# Patient Record
Sex: Male | Born: 1960 | Race: Black or African American | Hispanic: No | Marital: Single | Smoking: Current every day smoker
Health system: Southern US, Community
[De-identification: ages and names within clinical notes are randomized; demographics above are authoritative.]

## PROBLEM LIST (undated history)

## (undated) DIAGNOSIS — I2119 ST elevation (STEMI) myocardial infarction involving other coronary artery of inferior wall: Secondary | ICD-10-CM

## (undated) DIAGNOSIS — G4733 Obstructive sleep apnea (adult) (pediatric): Secondary | ICD-10-CM

## (undated) DIAGNOSIS — M4714 Other spondylosis with myelopathy, thoracic region: Secondary | ICD-10-CM

## (undated) DIAGNOSIS — R269 Unspecified abnormalities of gait and mobility: Secondary | ICD-10-CM

## (undated) DIAGNOSIS — E7800 Pure hypercholesterolemia, unspecified: Secondary | ICD-10-CM

## (undated) DIAGNOSIS — K219 Gastro-esophageal reflux disease without esophagitis: Secondary | ICD-10-CM

## (undated) DIAGNOSIS — Z720 Tobacco use: Secondary | ICD-10-CM

## (undated) HISTORY — DX: Other spondylosis with myelopathy, thoracic region: M47.14

## (undated) HISTORY — DX: Unspecified abnormalities of gait and mobility: R26.9

## (undated) HISTORY — PX: SPINE SURGERY: SHX786

## (undated) HISTORY — DX: Obstructive sleep apnea (adult) (pediatric): G47.33

---

## 2016-11-30 ENCOUNTER — Encounter (HOSPITAL_COMMUNITY)
Admission: EM | Disposition: A | Discharge status: Home / Self Care | Payer: Self-pay | Source: Home / Self Care | Attending: Interventional Cardiology

## 2016-11-30 ENCOUNTER — Encounter (HOSPITAL_COMMUNITY): Payer: Self-pay

## 2016-11-30 ENCOUNTER — Inpatient Hospital Stay (HOSPITAL_COMMUNITY)
Admission: EM | Admit: 2016-11-30 | Discharge: 2016-12-02 | DRG: 282 | Disposition: A | Discharge status: Home / Self Care | Payer: Medicaid Other | Attending: Interventional Cardiology | Admitting: Interventional Cardiology

## 2016-11-30 DIAGNOSIS — I2582 Chronic total occlusion of coronary artery: Secondary | ICD-10-CM | POA: Diagnosis present

## 2016-11-30 DIAGNOSIS — G4733 Obstructive sleep apnea (adult) (pediatric): Secondary | ICD-10-CM | POA: Diagnosis present

## 2016-11-30 DIAGNOSIS — K219 Gastro-esophageal reflux disease without esophagitis: Secondary | ICD-10-CM | POA: Diagnosis present

## 2016-11-30 DIAGNOSIS — E669 Obesity, unspecified: Secondary | ICD-10-CM | POA: Diagnosis present

## 2016-11-30 DIAGNOSIS — Z23 Encounter for immunization: Secondary | ICD-10-CM | POA: Diagnosis not present

## 2016-11-30 DIAGNOSIS — E7800 Pure hypercholesterolemia, unspecified: Secondary | ICD-10-CM

## 2016-11-30 DIAGNOSIS — Z8249 Family history of ischemic heart disease and other diseases of the circulatory system: Secondary | ICD-10-CM | POA: Diagnosis not present

## 2016-11-30 DIAGNOSIS — Z9861 Coronary angioplasty status: Secondary | ICD-10-CM

## 2016-11-30 DIAGNOSIS — I2121 ST elevation (STEMI) myocardial infarction involving left circumflex coronary artery: Secondary | ICD-10-CM | POA: Diagnosis not present

## 2016-11-30 DIAGNOSIS — G8929 Other chronic pain: Secondary | ICD-10-CM | POA: Diagnosis not present

## 2016-11-30 DIAGNOSIS — I213 ST elevation (STEMI) myocardial infarction of unspecified site: Secondary | ICD-10-CM | POA: Diagnosis present

## 2016-11-30 DIAGNOSIS — Z6837 Body mass index (BMI) 37.0-37.9, adult: Secondary | ICD-10-CM | POA: Diagnosis not present

## 2016-11-30 DIAGNOSIS — T448X5A Adverse effect of centrally-acting and adrenergic-neuron-blocking agents, initial encounter: Secondary | ICD-10-CM | POA: Diagnosis not present

## 2016-11-30 DIAGNOSIS — E785 Hyperlipidemia, unspecified: Secondary | ICD-10-CM | POA: Diagnosis present

## 2016-11-30 DIAGNOSIS — Z720 Tobacco use: Secondary | ICD-10-CM | POA: Diagnosis not present

## 2016-11-30 DIAGNOSIS — I2119 ST elevation (STEMI) myocardial infarction involving other coronary artery of inferior wall: Secondary | ICD-10-CM | POA: Diagnosis present

## 2016-11-30 DIAGNOSIS — I2510 Atherosclerotic heart disease of native coronary artery without angina pectoris: Secondary | ICD-10-CM | POA: Diagnosis present

## 2016-11-30 DIAGNOSIS — G894 Chronic pain syndrome: Secondary | ICD-10-CM | POA: Diagnosis present

## 2016-11-30 DIAGNOSIS — F17290 Nicotine dependence, other tobacco product, uncomplicated: Secondary | ICD-10-CM | POA: Diagnosis present

## 2016-11-30 DIAGNOSIS — I952 Hypotension due to drugs: Secondary | ICD-10-CM | POA: Diagnosis not present

## 2016-11-30 DIAGNOSIS — I252 Old myocardial infarction: Secondary | ICD-10-CM | POA: Diagnosis present

## 2016-11-30 DIAGNOSIS — R079 Chest pain, unspecified: Secondary | ICD-10-CM | POA: Diagnosis present

## 2016-11-30 HISTORY — DX: ST elevation (STEMI) myocardial infarction involving other coronary artery of inferior wall: I21.19

## 2016-11-30 HISTORY — DX: Pure hypercholesterolemia, unspecified: E78.00

## 2016-11-30 HISTORY — DX: Tobacco use: Z72.0

## 2016-11-30 HISTORY — DX: Gastro-esophageal reflux disease without esophagitis: K21.9

## 2016-11-30 HISTORY — PX: CARDIAC CATHETERIZATION: SHX172

## 2016-11-30 LAB — TROPONIN I
TROPONIN I: 0.5 ng/mL — AB (ref <0.03)
TROPONIN I: 8.56 ng/mL — AB (ref <0.03)
Troponin I: 0.05 ng/mL (ref <0.03)

## 2016-11-30 LAB — POCT ACTIVATED CLOTTING TIME
Activated Clotting Time: 268 seconds
Activated Clotting Time: 324 seconds

## 2016-11-30 LAB — COMPREHENSIVE METABOLIC PANEL
ALBUMIN: 3.7 g/dL (ref 3.5–5.0)
ALK PHOS: 32 U/L — AB (ref 38–126)
ALT: 25 U/L (ref 17–63)
AST: 29 U/L (ref 15–41)
Anion gap: 11 (ref 5–15)
BUN: 12 mg/dL (ref 6–20)
CHLORIDE: 101 mmol/L (ref 101–111)
CO2: 24 mmol/L (ref 22–32)
Calcium: 9.4 mg/dL (ref 8.9–10.3)
Creatinine, Ser: 1.3 mg/dL — ABNORMAL HIGH (ref 0.61–1.24)
GFR calc Af Amer: >60 mL/min (ref >60)
GFR calc non Af Amer: >60 mL/min (ref >60)
GLUCOSE: 108 mg/dL — AB (ref 65–99)
Potassium: 4.1 mmol/L (ref 3.5–5.1)
SODIUM: 136 mmol/L (ref 135–145)
Total Bilirubin: 1 mg/dL (ref 0.3–1.2)
Total Protein: 7.4 g/dL (ref 6.5–8.1)

## 2016-11-30 LAB — DIFFERENTIAL
Basophils Absolute: 0 10*3/uL (ref 0.0–0.1)
Basophils Relative: 0 %
Eosinophils Absolute: 0.9 10*3/uL — ABNORMAL HIGH (ref 0.0–0.7)
Eosinophils Relative: 10 %
LYMPHS ABS: 3.4 10*3/uL (ref 0.7–4.0)
LYMPHS PCT: 36 %
MONO ABS: 0.9 10*3/uL (ref 0.1–1.0)
MONOS PCT: 9 %
NEUTROS ABS: 4.2 10*3/uL (ref 1.7–7.7)
Neutrophils Relative %: 45 %

## 2016-11-30 LAB — LIPID PANEL
CHOL/HDL RATIO: 5.1 ratio
Cholesterol: 218 mg/dL — ABNORMAL HIGH (ref 0–200)
HDL: 43 mg/dL (ref >40)
LDL CALC: 129 mg/dL — AB (ref 0–99)
Triglycerides: 228 mg/dL — ABNORMAL HIGH (ref <150)
VLDL: 46 mg/dL — ABNORMAL HIGH (ref 0–40)

## 2016-11-30 LAB — CBC
HEMATOCRIT: 48.5 % (ref 39.0–52.0)
HEMOGLOBIN: 17 g/dL (ref 13.0–17.0)
MCH: 36.5 pg — ABNORMAL HIGH (ref 26.0–34.0)
MCHC: 35.1 g/dL (ref 30.0–36.0)
MCV: 104.1 fL — AB (ref 78.0–100.0)
Platelets: 278 10*3/uL (ref 150–400)
RBC: 4.66 MIL/uL (ref 4.22–5.81)
RDW: 13.4 % (ref 11.5–15.5)
WBC: 9.4 10*3/uL (ref 4.0–10.5)

## 2016-11-30 LAB — HEPARIN LEVEL (UNFRACTIONATED): Heparin Unfractionated: 0.3 IU/mL (ref 0.30–0.70)

## 2016-11-30 LAB — PROTIME-INR
INR: 0.94
Prothrombin Time: 12.5 seconds (ref 11.4–15.2)

## 2016-11-30 LAB — APTT: APTT: 31 s (ref 24–36)

## 2016-11-30 LAB — TSH: TSH: 2.547 u[IU]/mL (ref 0.350–4.500)

## 2016-11-30 SURGERY — LEFT HEART CATH AND CORONARY ANGIOGRAPHY

## 2016-11-30 MED ORDER — VERAPAMIL HCL 2.5 MG/ML IV SOLN
INTRAVENOUS | Status: DC | PRN
  Administered 2016-11-30: 10 mL via INTRA_ARTERIAL

## 2016-11-30 MED ORDER — ALPRAZOLAM 0.25 MG PO TABS
0.2500 mg | ORAL_TABLET | Freq: Two times a day (BID) | ORAL | Status: DC | PRN
  Administered 2016-11-30: 0.25 mg via ORAL
  Filled 2016-11-30: qty 1

## 2016-11-30 MED ORDER — SODIUM CHLORIDE 0.9 % IV SOLN: INTRAVENOUS | Status: AC

## 2016-11-30 MED ORDER — HEPARIN SODIUM (PORCINE) 5000 UNIT/ML IJ SOLN
4000.0000 [IU] | Freq: Once | INTRAMUSCULAR | Status: AC
  Administered 2016-11-30: 4000 [IU] via INTRAVENOUS

## 2016-11-30 MED ORDER — MORPHINE SULFATE (PF) 2 MG/ML IV SOLN: 2.0000 mg | INTRAVENOUS | Status: DC | PRN

## 2016-11-30 MED ORDER — IOPAMIDOL (ISOVUE-370) INJECTION 76%
INTRAVENOUS | Status: AC
  Filled 2016-11-30: qty 100

## 2016-11-30 MED ORDER — ZOLPIDEM TARTRATE 5 MG PO TABS: 5.0000 mg | ORAL_TABLET | Freq: Every evening | ORAL | Status: DC | PRN

## 2016-11-30 MED ORDER — NICOTINE 21 MG/24HR TD PT24
21.0000 mg | MEDICATED_PATCH | Freq: Every day | TRANSDERMAL | Status: DC
  Administered 2016-11-30 – 2016-12-02 (×3): 21 mg via TRANSDERMAL
  Filled 2016-11-30 (×3): qty 1

## 2016-11-30 MED ORDER — CARVEDILOL 3.125 MG PO TABS
3.1250 mg | ORAL_TABLET | Freq: Two times a day (BID) | ORAL | Status: DC
  Administered 2016-11-30 – 2016-12-02 (×4): 3.125 mg via ORAL
  Filled 2016-11-30 (×4): qty 1

## 2016-11-30 MED ORDER — BACLOFEN 20 MG PO TABS
20.0000 mg | ORAL_TABLET | Freq: Two times a day (BID) | ORAL | Status: DC
  Administered 2016-11-30 – 2016-12-02 (×5): 20 mg via ORAL
  Filled 2016-11-30 (×6): qty 1

## 2016-11-30 MED ORDER — SODIUM CHLORIDE 0.9% FLUSH: 3.0000 mL | INTRAVENOUS | Status: DC | PRN

## 2016-11-30 MED ORDER — PREGABALIN 50 MG PO CAPS
300.0000 mg | ORAL_CAPSULE | Freq: Three times a day (TID) | ORAL | Status: DC
  Administered 2016-11-30 – 2016-12-02 (×8): 300 mg via ORAL
  Filled 2016-11-30: qty 6
  Filled 2016-11-30: qty 12
  Filled 2016-11-30 (×6): qty 6

## 2016-11-30 MED ORDER — HEPARIN (PORCINE) IN NACL 100-0.45 UNIT/ML-% IJ SOLN: 1300.0000 [IU]/h | INTRAMUSCULAR | Status: DC

## 2016-11-30 MED ORDER — MIDAZOLAM HCL 2 MG/2ML IJ SOLN
INTRAMUSCULAR | Status: AC
  Filled 2016-11-30: qty 2

## 2016-11-30 MED ORDER — PANTOPRAZOLE SODIUM 40 MG PO TBEC
40.0000 mg | DELAYED_RELEASE_TABLET | Freq: Every day | ORAL | Status: DC
  Administered 2016-11-30 – 2016-12-02 (×3): 40 mg via ORAL
  Filled 2016-11-30 (×3): qty 1

## 2016-11-30 MED ORDER — HEPARIN (PORCINE) IN NACL 2-0.9 UNIT/ML-% IJ SOLN
INTRAMUSCULAR | Status: AC
  Filled 2016-11-30: qty 500

## 2016-11-30 MED ORDER — NITROGLYCERIN IN D5W 200-5 MCG/ML-% IV SOLN: 20.0000 ug/min | INTRAVENOUS | Status: DC

## 2016-11-30 MED ORDER — FENTANYL CITRATE (PF) 100 MCG/2ML IJ SOLN
INTRAMUSCULAR | Status: AC
  Filled 2016-11-30: qty 2

## 2016-11-30 MED ORDER — SODIUM CHLORIDE 0.9 % IV SOLN
INTRAVENOUS | Status: DC | PRN
  Administered 2016-11-30: 200 mL/h via INTRAVENOUS

## 2016-11-30 MED ORDER — CLOPIDOGREL BISULFATE 75 MG PO TABS
75.0000 mg | ORAL_TABLET | Freq: Every day | ORAL | Status: DC
  Administered 2016-12-01 – 2016-12-02 (×2): 75 mg via ORAL
  Filled 2016-11-30 (×2): qty 1

## 2016-11-30 MED ORDER — DOCUSATE SODIUM 100 MG PO CAPS
100.0000 mg | ORAL_CAPSULE | Freq: Two times a day (BID) | ORAL | Status: DC
  Administered 2016-11-30 – 2016-12-02 (×4): 100 mg via ORAL
  Filled 2016-11-30 (×4): qty 1

## 2016-11-30 MED ORDER — HEPARIN SODIUM (PORCINE) 5000 UNIT/ML IJ SOLN
INTRAMUSCULAR | Status: AC
  Filled 2016-11-30: qty 1

## 2016-11-30 MED ORDER — HYDROCODONE-ACETAMINOPHEN 10-325 MG PO TABS
1.0000 | ORAL_TABLET | Freq: Four times a day (QID) | ORAL | Status: DC
  Administered 2016-11-30 – 2016-12-02 (×8): 1 via ORAL
  Filled 2016-11-30 (×10): qty 1

## 2016-11-30 MED ORDER — ONDANSETRON HCL 4 MG/2ML IJ SOLN: 4.0000 mg | Freq: Four times a day (QID) | INTRAMUSCULAR | Status: DC | PRN

## 2016-11-30 MED ORDER — VERAPAMIL HCL 2.5 MG/ML IV SOLN
INTRAVENOUS | Status: AC
  Filled 2016-11-30: qty 2

## 2016-11-30 MED ORDER — MAGNESIUM HYDROXIDE 400 MG/5ML PO SUSP
30.0000 mL | Freq: Every day | ORAL | Status: DC | PRN
  Administered 2016-11-30 – 2016-12-02 (×2): 30 mL via ORAL
  Filled 2016-11-30 (×2): qty 30

## 2016-11-30 MED ORDER — SODIUM CHLORIDE 0.9% FLUSH
3.0000 mL | Freq: Two times a day (BID) | INTRAVENOUS | Status: DC
  Administered 2016-11-30 – 2016-12-02 (×3): 3 mL via INTRAVENOUS

## 2016-11-30 MED ORDER — IOPAMIDOL (ISOVUE-370) INJECTION 76%
INTRAVENOUS | Status: DC | PRN
  Administered 2016-11-30: 210 mL via INTRA_ARTERIAL

## 2016-11-30 MED ORDER — ACETAMINOPHEN 325 MG PO TABS: 650.0000 mg | ORAL_TABLET | ORAL | Status: DC | PRN

## 2016-11-30 MED ORDER — HEPARIN (PORCINE) IN NACL 2-0.9 UNIT/ML-% IJ SOLN
INTRAMUSCULAR | Status: DC | PRN
  Administered 2016-11-30: 1500 mL

## 2016-11-30 MED ORDER — HEPARIN SODIUM (PORCINE) 1000 UNIT/ML IJ SOLN
INTRAMUSCULAR | Status: AC
  Filled 2016-11-30: qty 1

## 2016-11-30 MED ORDER — BISACODYL 10 MG RE SUPP: 10.0000 mg | Freq: Every day | RECTAL | Status: DC | PRN

## 2016-11-30 MED ORDER — LIDOCAINE HCL (PF) 1 % IJ SOLN
INTRAMUSCULAR | Status: DC | PRN
  Administered 2016-11-30: 2 mL via INTRADERMAL

## 2016-11-30 MED ORDER — PNEUMOCOCCAL VAC POLYVALENT 25 MCG/0.5ML IJ INJ: 0.5000 mL | INJECTION | INTRAMUSCULAR | Status: DC

## 2016-11-30 MED ORDER — ASPIRIN 81 MG PO CHEW: 324.0000 mg | CHEWABLE_TABLET | Freq: Once | ORAL | Status: AC

## 2016-11-30 MED ORDER — SODIUM CHLORIDE 0.9% FLUSH
3.0000 mL | Freq: Two times a day (BID) | INTRAVENOUS | Status: DC
  Administered 2016-11-30 – 2016-12-02 (×4): 3 mL via INTRAVENOUS

## 2016-11-30 MED ORDER — HYDRALAZINE HCL 20 MG/ML IJ SOLN: 5.0000 mg | INTRAMUSCULAR | Status: AC | PRN

## 2016-11-30 MED ORDER — HEPARIN (PORCINE) IN NACL 100-0.45 UNIT/ML-% IJ SOLN
1400.0000 [IU]/h | INTRAMUSCULAR | Status: AC
  Administered 2016-11-30: 1300 [IU]/h via INTRAVENOUS
  Administered 2016-12-01: 1400 [IU]/h via INTRAVENOUS
  Filled 2016-11-30: qty 250

## 2016-11-30 MED ORDER — SODIUM CHLORIDE 0.9 % IV SOLN: 250.0000 mL | INTRAVENOUS | Status: DC | PRN

## 2016-11-30 MED ORDER — CLOPIDOGREL BISULFATE 300 MG PO TABS
ORAL_TABLET | ORAL | Status: DC | PRN
  Administered 2016-11-30: 600 mg via ORAL

## 2016-11-30 MED ORDER — NITROGLYCERIN IN D5W 200-5 MCG/ML-% IV SOLN
INTRAVENOUS | Status: DC | PRN
  Administered 2016-11-30: 10 ug/min via INTRAVENOUS

## 2016-11-30 MED ORDER — FENTANYL CITRATE (PF) 100 MCG/2ML IJ SOLN
INTRAMUSCULAR | Status: DC | PRN
  Administered 2016-11-30 (×3): 50 ug via INTRAVENOUS

## 2016-11-30 MED ORDER — NITROGLYCERIN 0.4 MG SL SUBL: 0.4000 mg | SUBLINGUAL_TABLET | SUBLINGUAL | Status: DC | PRN

## 2016-11-30 MED ORDER — HEPARIN SODIUM (PORCINE) 1000 UNIT/ML IJ SOLN
INTRAMUSCULAR | Status: DC | PRN
  Administered 2016-11-30: 2000 [IU] via INTRAVENOUS
  Administered 2016-11-30: 7000 [IU] via INTRAVENOUS

## 2016-11-30 MED ORDER — OXYCODONE-ACETAMINOPHEN 5-325 MG PO TABS
1.0000 | ORAL_TABLET | ORAL | Status: DC | PRN
  Administered 2016-11-30 – 2016-12-02 (×3): 1 via ORAL
  Filled 2016-11-30 (×3): qty 1

## 2016-11-30 MED ORDER — ATORVASTATIN CALCIUM 80 MG PO TABS
80.0000 mg | ORAL_TABLET | Freq: Every day | ORAL | Status: DC
  Administered 2016-11-30 – 2016-12-01 (×2): 80 mg via ORAL
  Filled 2016-11-30 (×2): qty 1

## 2016-11-30 MED ORDER — MIDAZOLAM HCL 2 MG/2ML IJ SOLN
INTRAMUSCULAR | Status: DC | PRN
  Administered 2016-11-30 (×2): 1 mg via INTRAVENOUS

## 2016-11-30 MED ORDER — LABETALOL HCL 5 MG/ML IV SOLN: 10.0000 mg | INTRAVENOUS | Status: AC | PRN

## 2016-11-30 MED ORDER — ASPIRIN 81 MG PO CHEW
81.0000 mg | CHEWABLE_TABLET | Freq: Every day | ORAL | Status: DC
  Administered 2016-12-01 – 2016-12-02 (×2): 81 mg via ORAL
  Filled 2016-11-30 (×2): qty 1

## 2016-11-30 MED ORDER — LORAZEPAM 2 MG/ML IJ SOLN: 0.5000 mg | INTRAMUSCULAR | Status: DC | PRN

## 2016-11-30 MED ORDER — INFLUENZA VAC SPLIT QUAD 0.5 ML IM SUSY
0.5000 mL | PREFILLED_SYRINGE | Freq: Once | INTRAMUSCULAR | Status: DC
  Filled 2016-11-30: qty 0.5

## 2016-11-30 MED ORDER — HYDROCODONE-ACETAMINOPHEN 10-325 MG PO TABS
1.0000 | ORAL_TABLET | Freq: Four times a day (QID) | ORAL | Status: DC | PRN
  Administered 2016-11-30: 1 via ORAL
  Filled 2016-11-30: qty 1

## 2016-11-30 SURGICAL SUPPLY — 17 items
BALLN MOZEC 2.0X12 (BALLOONS) ×3
BALLOON MOZEC 2.0X12 (BALLOONS) ×1 IMPLANT
CATH EXPO 5F FL3.5 (CATHETERS) ×3 IMPLANT
CATH EXPO 5FR FR4 (CATHETERS) ×3 IMPLANT
CATH VISTA GUIDE 6FR XB3 (CATHETERS) ×3 IMPLANT
DEVICE RAD COMP TR BAND LRG (VASCULAR PRODUCTS) ×3 IMPLANT
ELECT DEFIB PAD ADLT CADENCE (PAD) ×3 IMPLANT
GLIDESHEATH SLEND A-KIT 6F 22G (SHEATH) ×3 IMPLANT
GUIDEWIRE INQWIRE 1.5J.035X260 (WIRE) ×1 IMPLANT
INQWIRE 1.5J .035X260CM (WIRE) ×3
KIT ENCORE 26 ADVANTAGE (KITS) ×3 IMPLANT
KIT HEART LEFT (KITS) ×3 IMPLANT
PACK CARDIAC CATHETERIZATION (CUSTOM PROCEDURE TRAY) ×3 IMPLANT
TRANSDUCER W/STOPCOCK (MISCELLANEOUS) ×6 IMPLANT
TUBING CIL FLEX 10 FLL-RA (TUBING) ×3 IMPLANT
WIRE ASAHI PROWATER 180CM (WIRE) ×3 IMPLANT
WIRE HI TORQ BMW 190CM (WIRE) ×3 IMPLANT

## 2016-11-30 NOTE — Progress Notes (Signed)
Called to see pt re: agitation and wanting to leave.  Pt feels disrespected, feels his questions and concerns were not addressed. He has pain issues and became very agitated.   Spoke to pt, Water quality scientist had been in the room speaking with pt.  Dr Herbie Baltimore was also present.  Heard pt complaints.  Reassured him his home rx was ordered, reviewed it with him. Will schedule the hydrocodone to make sure he does not miss a dose. Will add Ativan prn, also has Ambien. Advised pt I could not send him home on Ativan or Ambien, but he can have these while here.   He is also having some increased CP when agitated, ECG checked and shows improved ST elevation from admission, but not back to normal. Pt advised by Dr Herbie Baltimore that Dr Katrinka Blazing was unable to open the vessel that caused his MI, but we will use rx to help him feel better.  Will add low-dose BB, HR currently elevated and believe BP will tolerate.  Pt feeling better, continue current care.  Theodore Demark, PA-C 11/30/2016 12:08 PM Beeper [phone]

## 2016-11-30 NOTE — Progress Notes (Signed)
ANTICOAGULATION CONSULT NOTE - Initial Consult  Pharmacy Consult for heparin Indication: chest pain/ACS  No Known Allergies  Patient Measurements: Height: 5\' 10"  (177.8 cm) Weight: 265 lb (120.2 kg) IBW/kg (Calculated) : 73 Heparin Dosing Weight: 100  Vital Signs: Temp: 98.2 F (36.8 C) (01/29 1147) Temp Source: Oral (01/29 1147) BP: 104/59 (01/29 1300) Pulse Rate: 75 (01/29 1300)  Labs:  Recent Labs  11/30/16 0653 11/30/16 1126  HGB 17.0  --   HCT 48.5  --   PLT 278  --   APTT 31  --   LABPROT 12.5  --   INR 0.94  --   CREATININE 1.30*  --   TROPONINI 0.05* 0.50*    Estimated Creatinine Clearance: 83.5 mL/min (by C-G formula based on SCr of 1.3 mg/dL (H)).   Medical History: Past Medical History:  Diagnosis Date  . Acute ST elevation myocardial infarction (STEMI) of inferior wall (HCC) 11/30/2016  . GERD (gastroesophageal reflux disease)   . Hypercholesteremia   . Tobacco use 11/30/2016    Assessment: 55 YOM admitted with STEMI. In cath lab full occlusion of OM3 found, to be treated medically. Patient has been bolused with heparin. TR band removed at 1300, will start heparin gtt at 1700. H/H, Plt WNL. No bleeding per RN.  Goal of Therapy:  Heparin level 0.3-0.7 units/ml Monitor platelets by anticoagulation protocol: Yes   Plan:  Heparin gtt 1300 units/hr starting at 1700 F/u 6 hour heparin level @ 2300 Daily heparin level/CBC Monitor s/sx bleeding  Lianne Cure, PharmD Candidate 11/30/2016,1:44 PM

## 2016-11-30 NOTE — Progress Notes (Signed)
Patient very distrusting and agitated with department and ancillary staff.  Devon, DD, Dr. Herbie Baltimore and Runell Gess at the bedside speaking with patient. Pt. complained of chest pain.  EKG performed and given to MD.  Orders given and initiated.

## 2016-11-30 NOTE — ED Triage Notes (Signed)
Pt comes via [facility] woke up around 530 am sudden LUQ pain in abd, 12 lead shows STEMI, PTA received 324 ASA, CBG 141, pain 8/10

## 2016-11-30 NOTE — Progress Notes (Signed)
Lab called to inform RN of Troponin 0.5, Cardiology aware.

## 2016-11-30 NOTE — ED Provider Notes (Signed)
[facility]-EMERGENCY DEPT Provider Note   CSN: [id number] Arrival date & time: 11/30/16  4098     History   Chief Complaint Chief Complaint  Patient presents with  . Code STEMI    HPI Keith Salinas is a 56 y.o. male.  Patient presents as a code STEMI. He reports onset of left upper abdominal pain at 5:30 AM. No associated shortness of breath. Patient does not report a previous history of cardiac disease. EKG performed by EMS consistent with inferior lateral elevation MI. Patient complaining of 8 out of 10 pain currently.      Past Medical History:  Diagnosis Date  . GERD (gastroesophageal reflux disease)   . Hypercholesteremia     There are no active problems to display for this patient.   History reviewed. No pertinent surgical history.     Home Medications    Prior to Admission medications   Not on File    Family History No family history on file.  Social History Social History  Substance Use Topics  . Smoking status: Not on file  . Smokeless tobacco: Not on file  . Alcohol use No     Allergies   Patient has no known allergies.   Review of Systems Review of Systems  Cardiovascular: Positive for chest pain.  Gastrointestinal: Positive for abdominal pain.  All other systems reviewed and are negative.    Physical Exam Updated Vital Signs BP 128/92   Pulse 85   Temp 98.3 F (36.8 C) (Oral)   Resp 21   Ht 5\' 10"  (1.778 m)   Wt 265 lb (120.2 kg)   SpO2 99%   BMI 38.02 kg/m   Physical Exam  Constitutional: He is oriented to person, place, and time. He appears well-developed and well-nourished. No distress.  HENT:  Head: Normocephalic and atraumatic.  Right Ear: Hearing normal.  Left Ear: Hearing normal.  Nose: Nose normal.  Mouth/Throat: Oropharynx is clear and moist and mucous membranes are normal.  Eyes: Conjunctivae and EOM are normal. Pupils are equal, round, and reactive to light.  Neck: Normal range of motion. Neck supple.    Cardiovascular: Regular rhythm, S1 normal and S2 normal.  Exam reveals no gallop and no friction rub.   No murmur heard. Pulmonary/Chest: Effort normal and breath sounds normal. No respiratory distress. He exhibits no tenderness.  Abdominal: Soft. Normal appearance and bowel sounds are normal. There is no hepatosplenomegaly. There is tenderness in the left upper quadrant. There is no rebound, no guarding, no tenderness at McBurney's point and negative Murphy's sign. No hernia.  Musculoskeletal: Normal range of motion.  Neurological: He is alert and oriented to person, place, and time. He has normal strength. No cranial nerve deficit or sensory deficit. Coordination normal. GCS eye subscore is 4. GCS verbal subscore is 5. GCS motor subscore is 6.  Skin: Skin is warm, dry and intact. No rash noted. No cyanosis.  Psychiatric: He has a normal mood and affect. His speech is normal and behavior is normal. Thought content normal.  Nursing note and vitals reviewed.    ED Treatments / Results  Labs (all labs ordered are listed, but only abnormal results are displayed) Labs Reviewed  CBC  DIFFERENTIAL  PROTIME-INR  APTT  COMPREHENSIVE METABOLIC PANEL  TROPONIN I  LIPID PANEL    EKG  EKG Interpretation None       Radiology No results found.  Procedures Procedures (including critical care time)  Medications Ordered in ED Medications  heparin 5000 UNIT/ML  injection (not administered)  aspirin chewable tablet 324 mg (324 mg Oral Given by EMS 11/30/16 1914)  heparin injection 4,000 Units (4,000 Units Intravenous Given 11/30/16 0657)     Initial Impression / Assessment and Plan / ED Course  I have reviewed the triage vital signs and the nursing notes.  Pertinent labs & imaging results that were available during my care of the patient were reviewed by me and considered in my medical decision making (see chart for details).     Patient presents to the emergency department for  evaluation of abdominal and left-sided lower chest discomfort that started at 5:30 AM. EKG is consistent with inferior and lateral ST elevation MI. She will be taken to the Cath Lab for emergent cardiac cath.  CRITICAL CARE Performed by: Gilda Crease   Total critical care time: 30 minutes  Critical care time was exclusive of separately billable procedures and treating other patients.  Critical care was necessary to treat or prevent imminent or life-threatening deterioration.  Critical care was time spent personally by me on the following activities: development of treatment plan with patient and/or surrogate as well as nursing, discussions with consultants, evaluation of patient's response to treatment, examination of patient, obtaining history from patient or surrogate, ordering and performing treatments and interventions, ordering and review of laboratory studies, ordering and review of radiographic studies, pulse oximetry and re-evaluation of patient's condition.   Final Clinical Impressions(s) / ED Diagnoses   Final diagnoses:  ST elevation myocardial infarction (STEMI), unspecified artery [facility])    New Prescriptions New Prescriptions   No medications on file     Gilda Crease, MD 11/30/16 0700

## 2016-11-30 NOTE — ED Notes (Signed)
Cath lab ready for pt.  

## 2016-11-30 NOTE — H&P (Addendum)
History and Physical   Patient ID: Keith Salinas MRN: [id number], DOB/AGE: January 16, 1961 56 y.o. Date of Encounter: 11/30/2016  Primary Physician: [facility] Primary Cardiologist: New  Chief Complaint:  Inferior STEMI  HPI: Keith Salinas is a 56 y.o. male with a history of obesity, HLD, tob use, FH CAD at a later age.  Today, at about 5 AM, he got up to go to the bathroom and had onset of sharp pain that radiated from his left neck down into his  Chest. It was severe. He called EMS, they gave him baby aspirin 4,and transported him emergently to [facility]. Upon arrival to the emergency room, he was still having 7/10 chest pain. His ECG was abnormal, consistent with an inferior MI. He was taken emergently to the cath lab.  He has never had this kind of pain before. He was short of breath with the pain, and may be a little nauseated. He has not had diaphoresis.  He denies any history of diabetes, hypertension, or kidney problems. He smokes cigars, but denies alcohol or drug use. His parents both had CAD, but at a later age and died when they were in their 71s or 60s.   Past Medical History:  Diagnosis Date  . Acute ST elevation myocardial infarction (STEMI) of inferior wall (HCC) 11/30/2016  . GERD (gastroesophageal reflux disease)   . Hypercholesteremia   . Tobacco use 11/30/2016    Surgical History:  Past Surgical History:  Procedure Laterality Date  . SPINE SURGERY       I have reviewed the patient's current medications. Prior to Admission medications   Not on File   Scheduled Meds: . heparin       Continuous Infusions: . heparin     PRN Meds:.fentaNYL, heparin, heparin, lidocaine (PF), midazolam, Radial Cocktail/Verapamil only  Allergies: No Known Allergies  Social History   Social History  . Marital status: N/A    Spouse name: N/A  . Number of children: N/A  . Years of education: N/A   Occupational History  . Disabled    Social History Main  Topics  . Smoking status: Current Every Day Smoker    Types: Cigars  . Smokeless tobacco: Never Used  . Alcohol use No  . Drug use: No  . Sexual activity: Not on file   Other Topics Concern  . Not on file   Social History Narrative  . No narrative on file    Family History  Problem Relation Age of Onset  . CAD Mother   . CAD Father    Family Status  Relation Status  . Mother Deceased  . Father Deceased    Review of Systems:   Full 14-point review of systems otherwise negative except as noted above.  Physical Exam: Blood pressure 128/92, pulse 85, temperature 98.3 F (36.8 C), temperature source Oral, resp. rate 21, height 5\' 10"  (1.778 m), weight 265 lb (120.2 kg), SpO2 97 %. General: Well developed, well nourished,male in no acute distress. Head: Normocephalic, atraumatic, sclera non-icteric, no xanthomas, nares are without discharge. Dentition: poor Neck: No carotid bruits. JVD not elevated. No thyromegally Lungs: Good expansion bilaterally. without wheezes or rhonchi.  Heart: Regular rate and rhythm with S1 S2.  No S3 or S4.  No murmur, no rubs, or gallops appreciated. Abdomen: Soft, non-tender, non-distended with normoactive bowel sounds. No hepatomegaly. No rebound/guarding. No obvious abdominal masses. Msk:  Strength and tone appear normal for age. No joint deformities or effusions, no  spine or costo-vertebral angle tenderness. Extremities: No clubbing or cyanosis. No edema.  Distal pedal pulses are 2+ in 4 extrem Neuro: Alert and oriented X 3. Moves all extremities spontaneously. No focal deficits noted. Psych:  Responds to questions appropriately with a normal affect. Skin: No rashes or lesions noted  Labs: Pending  No results found for: WBC, HGB, HCT, MCV, PLT No results for input(s): INR in the last 72 hours. No results for input(s): NA, K, CL, CO2, BUN, CREATININE, CALCIUM, PROT, BILITOT, ALKPHOS, ALT, AST, GLUCOSE in the last 168 hours.  Invalid input(s):  LABALBU No results for input(s): CKTOTAL, CKMB, TROPONINI in the last 72 hours. No results for input(s): TROPIPOC in the last 72 hours. No results found for: CHOL, HDL, LDLCALC, TRIG No results found for: BNP No results found for: PROBNP No results found for: DDIMER  Radiology/Studies: No results found.   Cardiac Cath: pending  Echo: n/a  ECG: SR, Inferior ST elevation  ASSESSMENT AND PLAN:  Principal Problem:   Acute ST elevation myocardial infarction (STEMI) of inferior wall (HCC) - pt being taken emergently to the cath lab with further evaluation and treatment depending on the results. - he will be continued on his home medications and screened for cardiac risk factors and their control.  Active Problems:   Tobacco use    GERD   Signed, Leanna Battles 11/30/2016 7:18 AM Beeper [phone]  The patient has been seen in conjunction with Theodore Demark, PAC. All aspects of care have been considered and discussed. The patient has been personally interviewed, examined, and all clinical data has been reviewed.   STEMI activation with real inferolateral STE c/w MI.. Ongoing pain.  Plan is Emergency cath and mechanical revascularization if required.   Critical care time 30 minutes related to formulating emergency care counseling.

## 2016-12-01 ENCOUNTER — Encounter (HOSPITAL_COMMUNITY): Payer: Self-pay | Admitting: Interventional Cardiology

## 2016-12-01 DIAGNOSIS — E785 Hyperlipidemia, unspecified: Secondary | ICD-10-CM

## 2016-12-01 DIAGNOSIS — I2510 Atherosclerotic heart disease of native coronary artery without angina pectoris: Secondary | ICD-10-CM | POA: Diagnosis present

## 2016-12-01 DIAGNOSIS — G894 Chronic pain syndrome: Secondary | ICD-10-CM

## 2016-12-01 LAB — BASIC METABOLIC PANEL
ANION GAP: 7 (ref 5–15)
BUN: 11 mg/dL (ref 6–20)
CALCIUM: 8.9 mg/dL (ref 8.9–10.3)
CO2: 24 mmol/L (ref 22–32)
Chloride: 104 mmol/L (ref 101–111)
Creatinine, Ser: 1.23 mg/dL (ref 0.61–1.24)
GFR calc Af Amer: >60 mL/min (ref >60)
GFR calc non Af Amer: >60 mL/min (ref >60)
Glucose, Bld: 116 mg/dL — ABNORMAL HIGH (ref 65–99)
Potassium: 4.1 mmol/L (ref 3.5–5.1)
Sodium: 135 mmol/L (ref 135–145)

## 2016-12-01 LAB — CBC
HEMATOCRIT: 43.6 % (ref 39.0–52.0)
HEMOGLOBIN: 14.8 g/dL (ref 13.0–17.0)
MCH: 35.5 pg — AB (ref 26.0–34.0)
MCHC: 33.9 g/dL (ref 30.0–36.0)
MCV: 104.6 fL — ABNORMAL HIGH (ref 78.0–100.0)
Platelets: 270 10*3/uL (ref 150–400)
RBC: 4.17 MIL/uL — ABNORMAL LOW (ref 4.22–5.81)
RDW: 13.5 % (ref 11.5–15.5)
WBC: 11.7 10*3/uL — ABNORMAL HIGH (ref 4.0–10.5)

## 2016-12-01 LAB — TROPONIN I
TROPONIN I: 22.05 ng/mL — AB (ref <0.03)
Troponin I: 11.15 ng/mL (ref <0.03)
Troponin I: 13.76 ng/mL (ref <0.03)
Troponin I: 20.23 ng/mL (ref <0.03)

## 2016-12-01 LAB — HEMOGLOBIN A1C
HEMOGLOBIN A1C: 5.1 % (ref 4.8–5.6)
Mean Plasma Glucose: 100 mg/dL

## 2016-12-01 LAB — HEPARIN LEVEL (UNFRACTIONATED): Heparin Unfractionated: 0.46 IU/mL (ref 0.30–0.70)

## 2016-12-01 MED ORDER — MORPHINE SULFATE (PF) 2 MG/ML IV SOLN
2.0000 mg | INTRAVENOUS | Status: DC | PRN
  Administered 2016-12-01: 2 mg via INTRAVENOUS
  Filled 2016-12-01: qty 1

## 2016-12-01 MED ORDER — NITROGLYCERIN IN D5W 200-5 MCG/ML-% IV SOLN: 20.0000 ug/min | INTRAVENOUS | Status: DC

## 2016-12-01 MED ORDER — POLYETHYLENE GLYCOL 3350 17 G PO PACK
17.0000 g | PACK | Freq: Once | ORAL | Status: AC
  Administered 2016-12-01: 17 g via ORAL
  Filled 2016-12-01: qty 1

## 2016-12-01 NOTE — Progress Notes (Signed)
Progress Note  Patient Name: Keith Salinas Date of Encounter: 12/01/2016  Primary Cardiologist: Katrinka Blazing  Patient Profile     56 y.o. male with history of obesity, hyperlipidemia, tobacco abuse and family history of later aged CAD who presented on the morning of January 29 with severe left-sided chest pain radiating from his left neck and jaw down the chest.  He was noted to have inferior and lateral ST elevations and code STEMI was called. He was taken emergently to cardiac catheterization lab where he was found to have an occluded OM branch that was not able to be revascularized due to tortuosity of the vessel and unclear location of the guidewire.  Initially post cath, the patient became very upset with lab draws and was initially planning to go home AMA. We eventually calmed him down and he is now more stable, but has had recurrent pain overnight.  Subjective   Still having intermittent Sharp L sided CP - worse with certain movement.  Inpatient Medications    Scheduled Meds: . aspirin  81 mg Oral Daily  . atorvastatin  80 mg Oral q1800  . baclofen  20 mg Oral BID  . carvedilol  3.125 mg Oral BID WC  . clopidogrel  75 mg Oral Q breakfast  . docusate sodium  100 mg Oral BID  . HYDROcodone-acetaminophen  1 tablet Oral Q6H  . Influenza vac split quadrivalent PF  0.5 mL Intramuscular Once  . nicotine  21 mg Transdermal Daily  . pantoprazole  40 mg Oral Daily  . pneumococcal 23 valent vaccine  0.5 mL Intramuscular Tomorrow-1000  . pregabalin  300 mg Oral TID  . sodium chloride flush  3 mL Intravenous Q12H  . sodium chloride flush  3 mL Intravenous Q12H   Continuous Infusions: . heparin 1,400 Units/hr (12/01/16 0700)  . nitroGLYCERIN 20 mcg/min (12/01/16 0700)   PRN Meds: sodium chloride, sodium chloride, acetaminophen, ALPRAZolam, bisacodyl, LORazepam, magnesium hydroxide, morphine injection, nitroGLYCERIN, ondansetron (ZOFRAN) IV, oxyCODONE-acetaminophen, sodium chloride flush,  sodium chloride flush, zolpidem   Vital Signs    Vitals:   12/01/16 0500 12/01/16 0600 12/01/16 0700 12/01/16 0800  BP: 106/73 121/84 105/61 114/74  Pulse: 85 84 91 90  Resp: 14 19 16 12   Temp:      TempSrc:    Oral  SpO2: 95% 95% 94% 94%  Weight: 118.5 kg (261 lb 4.8 oz)     Height:        Intake/Output Summary (Last 24 hours) at 12/01/16 1020 Last data filed at 12/01/16 0800  Gross per 24 hour  Intake          1048.42 ml  Output             1725 ml  Net          -676.58 ml   Filed Weights   11/30/16 0652 12/01/16 0500  Weight: 120.2 kg (265 lb) 118.5 kg (261 lb 4.8 oz)    Telemetry    NSR - Personally Reviewed  ECG    Evolving Inferolateral MI changes - improved from presenting EKG - Personally Reviewed  Physical Exam   GEN: No acute distress. Obese.  Neck:  minimal JVD, no carotid bruit Cardiac:  distant heart sounds RRR, normal S1 and S2, no murmurs, rubs, or gallops.  Respiratory: Clear  to auscultation bilaterally. Nonlabored GI: Soft, nontender, non-distended  MS:  no edema; No deformity.; Radial cath site intact Neuro:  Nonfocal; normal cranial nerves Psych: Normal mood and much  more pleasant affect when compared to yesterday  Labs    Chemistry  Recent Labs Lab 11/30/16 0653 12/01/16 0623  NA 136 135  K 4.1 4.1  CL 101 104  CO2 24 24  GLUCOSE 108* 116*  BUN 12 11  CREATININE 1.30* 1.23  CALCIUM 9.4 8.9  PROT 7.4  --   ALBUMIN 3.7  --   AST 29  --   ALT 25  --   ALKPHOS 32*  --   BILITOT 1.0  --   GFRNONAA >60 >60  GFRAA >60 >60  ANIONGAP 11 7     Hematology  Recent Labs Lab 11/30/16 0653 12/01/16 0623  WBC 9.4 11.7*  RBC 4.66 4.17*  HGB 17.0 14.8  HCT 48.5 43.6  MCV 104.1* 104.6*  MCH 36.5* 35.5*  MCHC 35.1 33.9  RDW 13.4 13.5  PLT 278 270    Cardiac Enzymes  Recent Labs Lab 11/30/16 1126 11/30/16 1814 12/01/16 0000 12/01/16 0623  TROPONINI 0.50* 8.56* 11.15* 22.05*   No results for input(s): TROPIPOC in the  last 168 hours.   BNPNo results for input(s): BNP, PROBNP in the last 168 hours.   DDimer No results for input(s): DDIMER in the last 168 hours.   Radiology    No results found.  Cardiac Studies    Cardiac Catheterization with Attempted PCI 11/30/2016 - films personally reviewed   Conclusion   Acute inferolateral infarction due to total occlusion of the distal one third of a moderate sized third obtuse marginal.  Right dominant coronary anatomy.  Widely patent LAD, left main, and remaining portion of the circumflex.  Failed angioplasty of the distal third obtuse marginal due to the inability to establish antegrade flow. See details in procedure description.  Inferoapical severe hypokinesis. EF 45-50%. Left ventricular end-diastolic pressure as recorded is inaccurate.  RECOMMENDATIONS:   Medical therapy  High intensity statin therapy  IV heparin 24 hours  Plavix and aspirin  2-D Doppler echocardiogram   Diagnostic Diagram - able to cross the lesion with a wire, but unable to cross with balloon. Due to tortuosity, a stent was not attempted    Assessment & Plan    Principal Problem:   Acute ST elevation myocardial infarction (STEMI) of inferior wall (HCC) Active Problems:   Occlusive coronary artery disease requiring drug therapy - occluded OM 3. Unable to revascularize   Dyslipidemia, goal LDL below 70   Chronic pain syndrome - related to previous back surgery and arthritis. He is on high-dose of narcotics at home.   Tobacco use  Essentially the patient is starting to recover from a completed inferolateral MI with occluded OM 3. Unfortunately, Dr. Katrinka Blazing was unable to cross the lesion successfully and therefore we must treat his MI medically with IV heparin for 72 hours along with aspirin and Plavix. Narcotics and nitrate for pain relief.  I think some of his sharp chest pain may potentially be even pleuritic or pericarditic in nature based on the completed  infarct. Will need to monitor. 2-D echocardiogram pending. -- Need to monitor for signs of mechanical or electrical complication of a completed infarct.  Plan:  Continue IV heparin 72 hours.  On aspirin plus Plavix, therefore not able to use NSAIDs  Wean IV nitroglycerin over the course that up today and potentially start ACE inhibitor tomorrow   continue current dose of carvedilol and titrate as able  When necessary morphine for anginal chest pain  High-dose statin  Begin to ambulate in the hallways  today. Would keep him in the ICU at least for today and possibly tomorrow since he was not revascularized -- cardia rehabilitation to assess  NicoDerm patch. He has indicated that he would like to quit smoking.  Counseling provided.  He is on standing doses of anxiolytic and narcotic for his chronic pain syndrome. Reportedly he has OSA, will need to see if he is here long enough if we can get him on daily at bedtime CPAP . The patient seems to have calmed down quite a bit from yesterday. He is more relaxed and understands the plan. I explained to him that an echocardiogram is coming in that he will still have some blood work drawn, but after today there'll be less broad blood work.  I explained the importance of monitoring him post MI because of his completed infarct and not being revascularized. He seemed to voice understanding and was very appreciative of this discussion. He is very in a very collegial and jovial mood today. I also explained to him the severity of care in an ICU and that nurses may not be able to answer his call immediately and the patient's is warranted.     Signed, Bryan Lemma, MD  12/01/2016, 10:20 AM

## 2016-12-01 NOTE — Progress Notes (Signed)
    Called by RN because patient having some chest pain. He came in yesterday AM with anterolateral STEMI. He underwent emergent heart cath which showed total occlusion of the distal 1/3 of OM3. He underwent failed angioplasty of distal OM3 due to inability to establish antegrade flow. He was placed on IV heparin x24 hours and plan for medical therapy.   RN called me this AM because pt having some chest pain, that is similar to presentation pain but much less severe. ECG shows residual ST elevations in inferolateral leads, but improved from previous tracing. Will give IV morphine and continue nitro gtt and see if pain can be improved. Will have rounding team see first.    Cline Crock PA-C  [facility]

## 2016-12-01 NOTE — Progress Notes (Signed)
CARDIAC REHAB PHASE I   PRE:  Rate/Rhythm: 92 SR  BP:  Sitting: 110/71        SaO2: 93 2L  MODE:  Ambulation: to recliner   POST:  Rate/Rhythm: 97 SR  BP:  Sitting: 108/73         SaO2: 90 RA  Pt in bed, states he is ready to walk, states he uses a cane. Pt agreeable to use walker, pt stood with minimal assistance, however, relied heavily on walker to stand. Pt with poor posture, very unsteady gait, when questioned, pt states he suffered a back injury and was paralyzed at one time and now is able to walk only short distances. Pt returned to recliner for safety (no additional staff present to assist in ambulation). Pt states he has an aide that assists him for 5 hours a day, prepares his meal, assists with his medication and drives for him. Pt would benefit from PT consult to help maximize mobility, RN aware. Pt states he had a rollator at home, but it is now broken. Of note, pt also states he has trouble with his memory and sometimes forgets to take his medicine. Lastly, pt continues to complain of chest discomfort. Began MI/PCI education.  Reviewed risk factors, tobacco cessation (gave pt fake cigarette-he states he is willing to quit), MI book, anti-platelet therapy,activity restrictions, ntg, heart healthy diet, and phase 2 cardiac rehab. Pt verbalized understanding, receptive to education, needs reinforcement as able. Pt agrees to phase 2 cardiac rehab referral, will send to [facility]. Pt in recliner, call bell within reach. Will follow.  [id number] Joylene Grapes, RN, BSN 12/01/2016 10:40 AM

## 2016-12-01 NOTE — Progress Notes (Signed)
ANTICOAGULATION CONSULT NOTE - Follow Up Consult  Pharmacy Consult for heparin Indication: chest pain/ACS  No Known Allergies  Patient Measurements: Height: 5\' 10"  (177.8 cm) Weight: 261 lb 4.8 oz (118.5 kg) IBW/kg (Calculated) : 73 Heparin Dosing Weight: 100 kg  Vital Signs: Temp: 97.5 F (36.4 C) (01/30 0400) Temp Source: Axillary (01/30 0400) BP: 121/84 (01/30 0600) Pulse Rate: 84 (01/30 0600)  Labs:  Recent Labs  11/30/16 0653 11/30/16 1126 11/30/16 1814 11/30/16 2328 12/01/16 0000 12/01/16 0623  HGB 17.0  --   --   --   --  14.8  HCT 48.5  --   --   --   --  43.6  PLT 278  --   --   --   --  270  APTT 31  --   --   --   --   --   LABPROT 12.5  --   --   --   --   --   INR 0.94  --   --   --   --   --   HEPARINUNFRC  --   --   --  0.30  --  0.46  CREATININE 1.30*  --   --   --   --   --   TROPONINI 0.05* 0.50* 8.56*  --  11.15*  --     Estimated Creatinine Clearance: 82.8 mL/min (by C-G formula based on SCr of 1.3 mg/dL (H)).  Assessment: 75 YOM admitted with STEMI. In cath lab full occlusion of OM3 found, to be treated medically. Heparin level is therapeutic at 0.46 at a rate of 1300 units/hr. Hbg 17 to 14.8. No bleeding per RN.   Goal of Therapy:  Heparin level 0.3-0.7 units/ml Monitor platelets by anticoagulation protocol: Yes   Plan:  Heparin gtt 1400 units/hr until 1700 Monitor s/sx bleeding  Lianne Cure, PharmD Candidate 12/01/2016,7:43 AM

## 2016-12-01 NOTE — Progress Notes (Signed)
ANTICOAGULATION CONSULT NOTE - Follow-up Consult  Pharmacy Consult for heparin Indication: chest pain/ACS  No Known Allergies  Patient Measurements: Height: 5\' 10"  (177.8 cm) Weight: 265 lb (120.2 kg) IBW/kg (Calculated) : 73 Heparin Dosing Weight: 100  Vital Signs: Temp: 98.5 F (36.9 C) (01/29 2350) Temp Source: Oral (01/29 2350) BP: 125/77 (01/29 2300) Pulse Rate: 89 (01/29 2350)  Labs:  Recent Labs  11/30/16 0653 11/30/16 1126 11/30/16 1814 11/30/16 2328  HGB 17.0  --   --   --   HCT 48.5  --   --   --   PLT 278  --   --   --   APTT 31  --   --   --   LABPROT 12.5  --   --   --   INR 0.94  --   --   --   HEPARINUNFRC  --   --   --  0.30  CREATININE 1.30*  --   --   --   TROPONINI 0.05* 0.50* 8.56*  --     Estimated Creatinine Clearance: 83.5 mL/min (by C-G formula based on SCr of 1.3 mg/dL (H)).  Assessment: 22 YOM admitted with STEMI. In cath lab full occlusion of OM3 found, to be treated medically. Heparin restarted post cath. Heparin level at low end of therapeutic (0.3) on gtt at 1300 units/hr. No bleeding noted.  Goal of Therapy:  Heparin level 0.3-0.7 units/ml Monitor platelets by anticoagulation protocol: Yes   Plan:  Increase heparin slightly to 1400 units/hr to keep in therapeutic range F/u 6 hr heparin level Monitor s/sx bleeding  Christoper Fabian, PharmD, BCPS Clinical pharmacist, pager [phone] 12/01/2016,12:04 AM

## 2016-12-02 ENCOUNTER — Inpatient Hospital Stay (HOSPITAL_COMMUNITY): Payer: Medicaid Other

## 2016-12-02 DIAGNOSIS — I2510 Atherosclerotic heart disease of native coronary artery without angina pectoris: Secondary | ICD-10-CM

## 2016-12-02 LAB — ECHOCARDIOGRAM COMPLETE
HEIGHTINCHES: 70 in
WEIGHTICAEL: 4155.23 [oz_av]

## 2016-12-02 MED ORDER — HEPARIN SODIUM (PORCINE) 5000 UNIT/ML IJ SOLN
5000.0000 [IU] | Freq: Three times a day (TID) | INTRAMUSCULAR | Status: DC
Start: 2016-12-02 — End: 2016-12-02
  Filled 2016-12-02: qty 1

## 2016-12-02 MED ORDER — SENNOSIDES-DOCUSATE SODIUM 8.6-50 MG PO TABS
1.0000 | ORAL_TABLET | Freq: Two times a day (BID) | ORAL | Status: DC
  Administered 2016-12-02: 1 via ORAL
  Filled 2016-12-02: qty 1

## 2016-12-02 MED ORDER — CARVEDILOL 3.125 MG PO TABS: 3.1250 mg | ORAL_TABLET | Freq: Two times a day (BID) | ORAL | 11 refills | Status: DC

## 2016-12-02 MED ORDER — ISOSORBIDE MONONITRATE ER 30 MG PO TB24
30.0000 mg | ORAL_TABLET | Freq: Every day | ORAL | Status: DC
  Administered 2016-12-02: 30 mg via ORAL
  Filled 2016-12-02: qty 1

## 2016-12-02 MED ORDER — PANTOPRAZOLE SODIUM 40 MG PO TBEC: 40.0000 mg | DELAYED_RELEASE_TABLET | Freq: Every day | ORAL | 11 refills | Status: DC

## 2016-12-02 MED ORDER — POLYETHYLENE GLYCOL 3350 17 G PO PACK
17.0000 g | PACK | Freq: Every day | ORAL | Status: DC
  Administered 2016-12-02: 17 g via ORAL
  Filled 2016-12-02: qty 1

## 2016-12-02 MED ORDER — ATORVASTATIN CALCIUM 80 MG PO TABS: 80.0000 mg | ORAL_TABLET | Freq: Every day | ORAL | 11 refills | Status: DC

## 2016-12-02 MED ORDER — NICOTINE 21 MG/24HR TD PT24: 21.0000 mg | MEDICATED_PATCH | Freq: Every day | TRANSDERMAL | 0 refills | Status: DC

## 2016-12-02 MED ORDER — ORAL CARE MOUTH RINSE: 15.0000 mL | Freq: Two times a day (BID) | OROMUCOSAL | Status: DC

## 2016-12-02 MED ORDER — CARVEDILOL 6.25 MG PO TABS: 6.2500 mg | ORAL_TABLET | Freq: Two times a day (BID) | ORAL | Status: DC

## 2016-12-02 MED ORDER — ASPIRIN 81 MG PO CHEW: 81.0000 mg | CHEWABLE_TABLET | Freq: Every day | ORAL | 11 refills | Status: DC

## 2016-12-02 MED ORDER — ISOSORBIDE MONONITRATE ER 30 MG PO TB24: 30.0000 mg | ORAL_TABLET | Freq: Every day | ORAL | 6 refills | Status: DC

## 2016-12-02 MED ORDER — CARVEDILOL 3.125 MG PO TABS
3.1250 mg | ORAL_TABLET | Freq: Two times a day (BID) | ORAL | Status: DC
  Administered 2016-12-02: 3.125 mg via ORAL
  Filled 2016-12-02: qty 1

## 2016-12-02 MED ORDER — CLOPIDOGREL BISULFATE 75 MG PO TABS: 75.0000 mg | ORAL_TABLET | Freq: Every day | ORAL | 3 refills | Status: DC

## 2016-12-02 MED ORDER — NITROGLYCERIN 0.4 MG SL SUBL: 0.4000 mg | SUBLINGUAL_TABLET | SUBLINGUAL | 12 refills | Status: AC | PRN

## 2016-12-02 NOTE — Progress Notes (Addendum)
Progress Note  Patient Name: Keith Salinas Date of Encounter: 12/02/2016  Primary Cardiologist: Katrinka Blazing  Patient Profile     56 y.o. male with history of obesity, hyperlipidemia, tobacco abuse and family history of later aged CAD who presented on the morning of January 29 with severe left-sided chest pain radiating from his left neck and jaw down the chest.  He was noted to have inferior and lateral ST elevations and code STEMI was called. He was taken emergently to cardiac catheterization lab where he was found to have an occluded OM branch that was not able to be revascularized due to tortuosity of the vessel and unclear location of the guidewire.  Initially post cath, the patient became very upset with lab draws and was initially planning to go home AMA. We eventually calmed him down and he is now more stable, but has had recurrent pain overnight.  Subjective   Feeling much better. No issues with breathing. He had one episode of sharp chest discomfort this morning when he moved around. But not anything like his anginal pain.  Inpatient Medications    Scheduled Meds: . aspirin  81 mg Oral Daily  . atorvastatin  80 mg Oral q1800  . baclofen  20 mg Oral BID  . carvedilol  6.25 mg Oral BID WC  . clopidogrel  75 mg Oral Q breakfast  . heparin subcutaneous  5,000 Units Subcutaneous Q8H  . HYDROcodone-acetaminophen  1 tablet Oral Q6H  . Influenza vac split quadrivalent PF  0.5 mL Intramuscular Once  . isosorbide mononitrate  30 mg Oral Daily  . mouth rinse  15 mL Mouth Rinse BID  . nicotine  21 mg Transdermal Daily  . pantoprazole  40 mg Oral Daily  . pneumococcal 23 valent vaccine  0.5 mL Intramuscular Tomorrow-1000  . polyethylene glycol  17 g Oral Daily  . pregabalin  300 mg Oral TID  . senna-docusate  1 tablet Oral BID  . sodium chloride flush  3 mL Intravenous Q12H  . sodium chloride flush  3 mL Intravenous Q12H   Continuous Infusions:  PRN Meds: sodium chloride, sodium  chloride, acetaminophen, ALPRAZolam, bisacodyl, LORazepam, magnesium hydroxide, morphine injection, ondansetron (ZOFRAN) IV, oxyCODONE-acetaminophen, sodium chloride flush, sodium chloride flush, zolpidem   Vital Signs    Vitals:   12/02/16 0743 12/02/16 0800 12/02/16 0900 12/02/16 1000  BP: 111/72 129/78  104/61  Pulse:   86 87  Resp:  15 17 15   Temp: 97.9 F (36.6 C)     TempSrc: Oral     SpO2:  95% 94% 94%  Weight:      Height:        Intake/Output Summary (Last 24 hours) at 12/02/16 1101 Last data filed at 12/02/16 1000  Gross per 24 hour  Intake             1630 ml  Output             2400 ml  Net             -770 ml   Filed Weights   11/30/16 0652 12/01/16 0500 12/02/16 0400  Weight: 120.2 kg (265 lb) 118.5 kg (261 lb 4.8 oz) 117.8 kg (259 lb 11.2 oz)    Telemetry    NSR - Personally Reviewed - subtle ST elevations noted  ECG    No EKG today  Physical Exam   GEN: No acute distress. Obese.  Neck:  minimal JVD, no carotid bruit Cardiac:  distant heart  sounds RRR, normal S1 and S2, no murmurs, rubs, or gallops.  Respiratory: Clear  to auscultation bilaterally. Nonlabored GI: Soft, nontender, non-distended  MS:  no edema; No deformity.; Radial cath site intact Neuro:  Nonfocal; normal cranial nerves Psych: Normal mood and much more pleasant affect when compared to yesterday  Labs    Chemistry  Recent Labs Lab 11/30/16 0653 12/01/16 0623  NA 136 135  K 4.1 4.1  CL 101 104  CO2 24 24  GLUCOSE 108* 116*  BUN 12 11  CREATININE 1.30* 1.23  CALCIUM 9.4 8.9  PROT 7.4  --   ALBUMIN 3.7  --   AST 29  --   ALT 25  --   ALKPHOS 32*  --   BILITOT 1.0  --   GFRNONAA >60 >60  GFRAA >60 >60  ANIONGAP 11 7     Hematology  Recent Labs Lab 11/30/16 0653 12/01/16 0623  WBC 9.4 11.7*  RBC 4.66 4.17*  HGB 17.0 14.8  HCT 48.5 43.6  MCV 104.1* 104.6*  MCH 36.5* 35.5*  MCHC 35.1 33.9  RDW 13.4 13.5  PLT 278 270    Cardiac Enzymes  Recent  Labs Lab 12/01/16 0000 12/01/16 0623 12/01/16 1052 12/01/16 1537  TROPONINI 11.15* 22.05* 20.23* 13.76*   No results for input(s): TROPIPOC in the last 168 hours.   BNPNo results for input(s): BNP, PROBNP in the last 168 hours.   DDimer No results for input(s): DDIMER in the last 168 hours.   Radiology    No results found.  Cardiac Studies    Cardiac Catheterization with Attempted PCI 11/30/2016 - films personally reviewed   Conclusion   Acute inferolateral infarction due to total occlusion of the distal one third of a moderate sized third obtuse marginal.  Right dominant coronary anatomy.  Widely patent LAD, left main, and remaining portion of the circumflex.  Failed angioplasty of the distal third obtuse marginal due to the inability to establish antegrade flow. See details in procedure description.  Inferoapical severe hypokinesis. EF 45-50%. Left ventricular end-diastolic pressure as recorded is inaccurate.  RECOMMENDATIONS:   Medical therapy  High intensity statin therapy  IV heparin 24 hours  Plavix and aspirin  2-D Doppler echocardiogram   Diagnostic Diagram - able to cross the lesion with a wire, but unable to cross with balloon. Due to tortuosity, a stent was not attempted    Echocardiogram pending  Assessment & Plan    Principal Problem:   Acute ST elevation myocardial infarction (STEMI) of inferior wall (HCC) Active Problems:   Occlusive coronary artery disease requiring drug therapy - occluded OM 3. Unable to revascularize   Dyslipidemia, goal LDL below 70   Chronic pain syndrome - related to previous back surgery and arthritis. He is on high-dose of narcotics at home.   Tobacco use  Essentially the patient is starting to recover from a completed inferolateral MI with occluded OM 3. Unfortunately, Dr. Katrinka Blazing was unable to cross the lesion successfully and therefore we must treat his MI medically with IV heparin for 72 hours along with  aspirin and Plavix. Narcotics and nitrate for pain relief.  I think some of his sharp chest pain may potentially be even pleuritic or pericarditic in nature based on the completed infarct. Will need to monitor. 2-D echocardiogram pending. -- Need to monitor for signs of mechanical or electrical complication of a completed infarct. - Unfortunately echocardiogram has not been done. I would like to see this prior to considering  discharge.  Plan:  IV heparin Completed 72 hours.  On aspirin plus Plavix, therefore not able to use NSAIDs  Off IV nitroglycerin drip. Start Imdur. Did not choose to start ACE inhibitor because of increased his beta blocker  Titrate carvedilol to 6.25 twice a day  When necessary morphine for anginal chest pain  High-dose statin  Begin to ambulate in the hallways today. - PT and cardiac rehabilitation  He has pretty significant constipation on current regimen. We will increase with adding Colace/senna and potentially suppository versus enema.  NicoDerm patch. He has indicated that he would like to quit smoking.  Counseling provided - more counseling will be provided by the pharmacy team  He is on standing doses of anxiolytic and narcotic for his chronic pain syndrome. Reportedly he has OSA, will need to see if he is here long enough if we can get him on daily at bedtime CPAP . The patient seems to have calmed down quite a bit from yesterday. He is more relaxed and understands the plan. I explained to him that an echocardiogram is coming in that he will still have some blood work drawn, but after today there'll be less broad blood work.  He is functionally limited due to his prior back surgery and would probably benefit more from physical therapy then cardia rehabilitation. I will ask physical therapy to evaluate and assist today.  At this point stable enough to move to the telemetry floor. If he remains stable, would potentially be prepared for discharge either  today or tomorrow  Depending upon his Echo results (not yet done).   Signed, Bryan Lemma, MD  12/02/2016, 11:01 AM

## 2016-12-02 NOTE — Progress Notes (Signed)
Pt refused wheelchair or escort by staff out of building for discharge. Screaming as he went down the hall, cussing at staff. Tammy Sours

## 2016-12-02 NOTE — Progress Notes (Signed)
  Echocardiogram 2D Echocardiogram has been performed.  Keith Salinas L Androw 12/02/2016, 4:15 PM

## 2016-12-02 NOTE — Progress Notes (Signed)
EKG CRITICAL VALUE     12 lead EKG performed.  Critical value noted.  Caiti, RN notified.   Barb Merino, CCT 12/02/2016 7:17 AM

## 2016-12-02 NOTE — Progress Notes (Signed)
Pt get caps aid 7 days per week from 9am til 2 pm, caps worker at [phone], caps agency is regional at [phone]. Caps casw worker would like call at disch.

## 2016-12-02 NOTE — Evaluation (Signed)
Physical Therapy Evaluation Patient Details Name: Keith Salinas MRN: [id number] DOB: 05-Sep-1961 Today's Date: 12/02/2016   History of Present Illness  Keith Salinas is a 56 y.o. male with a history of obesity, HLD, tob use, FH CAD, who was admitted 11/30/16 with STEMI.  Patient also with h/o back surgery with continued LE weakness and pain.  Clinical Impression  Patient presents with problems listed below.  Patient to be d/c home today.  Recommend f/u HHPT for mobility, gait, and balance training.  To continue CAPs program for aide.  Ready for d/c from PT perspective.    Follow Up Recommendations Home health PT;Supervision - Intermittent    Equipment Recommendations  None recommended by PT    Recommendations for Other Services       Precautions / Restrictions Precautions Precautions: Fall Restrictions Weight Bearing Restrictions: No      Mobility  Bed Mobility Overal bed mobility: Modified Independent             General bed mobility comments: Increased time  Transfers Overall transfer level: Modified independent Equipment used: 4-wheeled walker             General transfer comment: Patient able to lock brakes and utilize rollator safely.  No physical assist required to stand from bed, toilet, and seat on rollator  Ambulation/Gait Ambulation/Gait assistance: Supervision Ambulation Distance (Feet): 70 Feet Assistive device: 4-wheeled walker Gait Pattern/deviations: Step-through pattern;Decreased step length - right;Decreased step length - left;Decreased stride length;Trunk flexed Gait velocity: decreased Gait velocity interpretation: Below normal speed for age/gender General Gait Details: Patient with slow gait with flexed posture.  Cues to stand upright to stay closer to rollator.  Stairs            Wheelchair Mobility    Modified Rankin (Stroke Patients Only)       Balance Overall balance assessment: Needs assistance         Standing  balance support: Single extremity supported Standing balance-Leahy Scale: Poor                               Pertinent Vitals/Pain Pain Assessment: Faces Faces Pain Scale: Hurts little more Pain Location: BLE's up to waist Pain Descriptors / Indicators: Aching;Sore Pain Intervention(s): Monitored during session;Repositioned    Home Living Family/patient expects to be discharged to:: Private residence Living Arrangements: Alone Available Help at Discharge: Family;Friend(s);Available PRN/intermittently;Personal care attendant (CAPs program 7 days/week, 5 hours/day) Type of Home: Apartment Home Access: Stairs to enter   Entrance Stairs-Number of Steps: 2 Home Layout: One level Home Equipment: Walker - 4 wheels;Cane - single point;Shower seat;Hand held shower head      Prior Function Level of Independence: Independent with assistive device(s);Needs assistance   Gait / Transfers Assistance Needed: Patient had been using cane pta.  Now has rollator.  ADL's / Homemaking Assistance Needed: CAPs Aide 7 days/week for ADL's, housework, laundry, etc.        Hand Dominance        Extremity/Trunk Assessment   Upper Extremity Assessment Upper Extremity Assessment: Overall WFL for tasks assessed    Lower Extremity Assessment Lower Extremity Assessment: Generalized weakness (Decreased sensation - decreased light touch)       Communication   Communication: No difficulties  Cognition Arousal/Alertness: Awake/alert Behavior During Therapy: WFL for tasks assessed/performed;Restless;Impulsive Overall Cognitive Status: Within Functional Limits for tasks assessed  General Comments: Decreased safety awareness baseline    General Comments      Exercises     Assessment/Plan    PT Assessment All further PT needs can be met in the next venue of care  PT Problem List Decreased strength;Decreased balance;Decreased mobility;Decreased safety  awareness;Cardiopulmonary status limiting activity;Impaired sensation;Obesity;Pain          PT Treatment Interventions      PT Goals (Current goals can be found in the Care Plan section)  Acute Rehab PT Goals Patient Stated Goal: To go home today PT Goal Formulation: All assessment and education complete, DC therapy (f/u HHPT)    Frequency     Barriers to discharge        Co-evaluation               End of Session Equipment Utilized During Treatment: Gait belt Activity Tolerance: Patient tolerated treatment well Patient left: in bed;with call bell/phone within reach;with nursing/sitter in room Nurse Communication: Mobility status (Recommend HHPT)         Time: 3568-6168 PT Time Calculation (min) (ACUTE ONLY): 33 min   Charges:   PT Evaluation $PT Eval Moderate Complexity: 1 Procedure PT Treatments $Gait Training: 8-22 mins   PT G Codes:        Despina Pole 12-15-16, 6:07 PM Carita Pian. Sanjuana Kava, [place] Pager [phone]

## 2016-12-02 NOTE — Discharge Summary (Signed)
Discharge Summary    Patient ID: Keith Salinas,  MRN: [id number], DOB/AGE: 56-Aug-1962 56 y.o.  Admit date: 11/30/2016 Discharge date: 12/02/2016  Primary Care Provider: No primary care provider on file. Primary Cardiologist: Dr. Katrinka Blazing  Discharge Diagnoses    Principal Problem:   Acute ST elevation myocardial infarction (STEMI) of inferior wall (HCC) Active Problems:   Dyslipidemia, goal LDL below 70   Tobacco use   Occlusive coronary artery disease requiring drug therapy - occluded OM 3. Unable to revascularize   Chronic pain syndrome - related to previous back surgery and arthritis. He is on high-dose of narcotics at home.   Allergies No Known Allergies  Diagnostic Studies/Procedures    Coronary Balloon Angioplasty  Left Heart Cath and Coronary Angiography  Conclusion     A stent was not successfully placed.  2nd Mrg lesion, 100 %stenosed.  Post intervention, there is a 100% residual stenosis.    Acute inferolateral infarction due to total occlusion of the distal one third of a moderate sized third obtuse marginal.  Right dominant coronary anatomy.  Widely patent LAD, left main, and remaining portion of the circumflex.  Failed angioplasty of the distal third obtuse marginal due to the inability to establish antegrade flow. See details in procedure description.  Inferoapical severe hypokinesis. EF 45-50%. Left ventricular end-diastolic pressure as recorded is inaccurate.  RECOMMENDATIONS:   Medical therapy  High intensity statin therapy  IV heparin 24 hours  Plavix and aspirin  2-D Doppler echocardiogram    Echo 12/02/16 LV EF: 55% -   60%  ------------------------------------------------------------------- Indications:      CAD of native vessels 414.01.  ------------------------------------------------------------------- History:   PMH:   Myocardial infarction.  Risk factors:  Current tobacco use.  Dyslipidemia.  ------------------------------------------------------------------- Study Conclusions  - Left ventricle: The cavity size was normal. Wall thickness was   increased increased in a pattern of mild to moderate LVH.   Systolic function was normal. The estimated ejection fraction was   in the range of 55% to 60%. Wall motion was normal; there were no   regional wall motion abnormalities. Features are consistent with   a pseudonormal left ventricular filling pattern, with concomitant   abnormal relaxation and increased filling pressure (grade 2   diastolic dysfunction).    History of Present Illness     Keith Salinas is a 56 y.o. male with a history of obesity, HLD, tob use, FH CAD at a later age presents with chest pain.   11/30/16, at about 5 AM, he got up to go to the bathroom and had onset of sharp pain that radiated from his left neck down into his  Chest. It was severe. He called EMS, they gave him baby aspirin 4,and transported him emergently to [facility]. Upon arrival to the emergency room, he was still having 7/10 chest pain. His ECG was abnormal, consistent with an inferior MI. He was taken emergently to the cath lab.  He has never had this kind of pain before. He was short of breath with the pain, and may be a little nauseated. He has not had diaphoresis.  He denies any history of diabetes, hypertension, or kidney problems. He smokes cigars, but denies alcohol or drug use. His parents both had CAD, but at a later age and died when they were in their 17s or 43s.  Hospital Course     Consultants: None  Emergent cath showed total occlusion of the distal one third of a moderate sized third  obtuse marginal. Unfortunately, Dr. Katrinka Blazing was unable to cross the lesion successfully and therefore we must treat his MI medically with IV heparin for 72 hours along with aspirin and Plavix. Narcotics and nitrate for pain relief. Some of his sharp chest pain may potentially be even  pleuritic or pericarditic in nature based on the completed infarct. Echo showed normal EF with grade 2 DD. Off IV nitroglycerin drip. Started Imdur. Did not choose to start ACE inhibitor because of increased his beta blocker. He was initially agitated that resolved with resumption of home medication. Ambulated well. referral made to CRP II.    The patient has been seen by Dr. Herbie Baltimore today and deemed ready for discharge home. All follow-up appointments have been scheduled. Discharge medications are listed below.  _____________   Discharge Vitals Blood pressure 93/72, pulse 87, temperature 98.3 F (36.8 C), temperature source Oral, resp. rate 15, height 5\' 10"  (1.778 m), weight 259 lb 11.2 oz (117.8 kg), SpO2 94 %.  Filed Weights   11/30/16 0652 12/01/16 0500 12/02/16 0400  Weight: 265 lb (120.2 kg) 261 lb 4.8 oz (118.5 kg) 259 lb 11.2 oz (117.8 kg)    Labs & Radiologic Studies     CBC  Recent Labs  11/30/16 0653 12/01/16 0623  WBC 9.4 11.7*  NEUTROABS 4.2  --   HGB 17.0 14.8  HCT 48.5 43.6  MCV 104.1* 104.6*  PLT 278 270   Basic Metabolic Panel  Recent Labs  11/30/16 0653 12/01/16 0623  NA 136 135  K 4.1 4.1  CL 101 104  CO2 24 24  GLUCOSE 108* 116*  BUN 12 11  CREATININE 1.30* 1.23  CALCIUM 9.4 8.9   Liver Function Tests  Recent Labs  11/30/16 0653  AST 29  ALT 25  ALKPHOS 32*  BILITOT 1.0  PROT 7.4  ALBUMIN 3.7   No results for input(s): LIPASE, AMYLASE in the last 72 hours. Cardiac Enzymes  Recent Labs  12/01/16 0623 12/01/16 1052 12/01/16 1537  TROPONINI 22.05* 20.23* 13.76*   BNP Invalid input(s): POCBNP D-Dimer No results for input(s): DDIMER in the last 72 hours. Hemoglobin A1C  Recent Labs  11/30/16 1126  HGBA1C 5.1   Fasting Lipid Panel  Recent Labs  11/30/16 0653  CHOL 218*  HDL 43  LDLCALC 129*  TRIG 228*  CHOLHDL 5.1   Thyroid Function Tests  Recent Labs  11/30/16 1126  TSH 2.547    No results  found.  Disposition   Pt is being discharged home today in good condition.  Follow-up Plans & Appointments    Follow-up Information    [facility] Office Follow up.   Specialty:  Cardiology Why:  office will call with time and date of appointment  Contact information: [address], [address] [place] [address] [phone]         Discharge Instructions    Amb Referral to Cardiac Rehabilitation    Complete by:  As directed    Diagnosis:   PTCA STEMI     Diet - low sodium heart healthy    Complete by:  As directed    Discharge instructions    Complete by:  As directed    No driving for 1 week. No lifting over 10 lbs for 4 weeks. No sexual activity for 4 weeks. You may not return to work until cleared by your cardiologist. Keep procedure site clean & dry. If you notice increased pain, swelling, bleeding or pus, call/return!  You may shower, but no soaking baths/hot tubs/pools for 1 week.   Increase activity slowly    Complete by:  As directed       Discharge Medications   Current Discharge Medication List    START taking these medications   Details  aspirin 81 MG chewable tablet Chew 1 tablet (81 mg total) by mouth daily. Qty: 30 tablet, Refills: 11    atorvastatin (LIPITOR) 80 MG tablet Take 1 tablet (80 mg total) by mouth daily at 6 PM. Qty: 30 tablet, Refills: 11    carvedilol (COREG) 3.125 MG tablet Take 1 tablet (3.125 mg total) by mouth 2 (two) times daily with a meal. Qty: 60 tablet, Refills: 11    clopidogrel (PLAVIX) 75 MG tablet Take 1 tablet (75 mg total) by mouth daily with breakfast. Qty: 90 tablet, Refills: 3    isosorbide mononitrate (IMDUR) 30 MG 24 hr tablet Take 1 tablet (30 mg total) by mouth daily. Qty: 30 tablet, Refills: 6    nicotine (NICODERM CQ - DOSED IN MG/24 HOURS) 21 mg/24hr patch Place 1 patch (21 mg total) onto the skin daily. Qty: 28 patch, Refills: 0    nitroGLYCERIN (NITROSTAT) 0.4 MG SL  tablet Place 1 tablet (0.4 mg total) under the tongue every 5 (five) minutes as needed for chest pain. Qty: 25 tablet, Refills: 12    pantoprazole (PROTONIX) 40 MG tablet Take 1 tablet (40 mg total) by mouth daily. Qty: 30 tablet, Refills: 11      CONTINUE these medications which have NOT CHANGED   Details  baclofen (LIORESAL) 10 MG tablet Take 20 mg by mouth 2 (two) times daily.    HYDROcodone-acetaminophen (NORCO) 10-325 MG tablet Take 1 tablet by mouth every 6 (six) hours as needed.    meloxicam (MOBIC) 15 MG tablet Take 15 mg by mouth daily.    pregabalin (LYRICA) 100 MG capsule Take 300 mg by mouth 3 (three) times daily.      STOP taking these medications     esomeprazole (NEXIUM) 40 MG capsule          Aspirin prescribed at discharge?  Yes High Intensity Statin Prescribed? (Lipitor 40-80mg  or Crestor 20-40mg ): Yes Beta Blocker Prescribed? Yes For EF 45% or less, Was ACEI/ARB Prescribed? N/A ADP Receptor Inhibitor Prescribed? (i.e. Plavix etc.-Includes Medically Managed Patients): Yes For EF <40%, Aldosterone Inhibitor Prescribed? N/A Was EF assessed during THIS hospitalization? Yes Was Cardiac Rehab II ordered? (Included Medically managed Patients): Yes   Outstanding Labs/Studies   Consider OP f/u labs 6-8 weeks given statin initiation this admission.  Duration of Discharge Encounter   Greater than 30 minutes including physician time.  Signed, Alante Tolan PA-C 12/02/2016, 5:15 PM

## 2016-12-04 ENCOUNTER — Encounter: Payer: Self-pay | Admitting: *Deleted

## 2016-12-07 ENCOUNTER — Ambulatory Visit: Payer: Medicaid Other | Admitting: Nurse Practitioner

## 2016-12-07 ENCOUNTER — Telehealth (HOSPITAL_COMMUNITY): Payer: Self-pay | Admitting: Cardiac Rehabilitation

## 2016-12-07 NOTE — Telephone Encounter (Signed)
pc to pt to discuss en rolling in care

## 2016-12-07 NOTE — Progress Notes (Deleted)
CARDIOLOGY OFFICE NOTE  Date:  12/07/2016    [name] Date of Birth: [birth year] Medical Record #[id number]  PCP:  No primary care provider on file.  Cardiologist:  Tyrone Sage & ***    No chief complaint on file.   History of Present Illness: Keith Salinas is a 56 y.o. male who presents today for a ***   Comes in today. Here with   Past Medical History:  Diagnosis Date  . Acute ST elevation myocardial infarction (STEMI) of inferior wall (HCC) 11/30/2016  . GERD (gastroesophageal reflux disease)   . Hypercholesteremia   . Tobacco use 11/30/2016    Past Surgical History:  Procedure Laterality Date  . CARDIAC CATHETERIZATION N/A 11/30/2016   Procedure: Left Heart Cath and Coronary Angiography;  Surgeon: Lyn Records, MD;  Location[facility] INVASIVE CV LAB;  Service: Cardiovascular;  Laterality: N/A;  . CARDIAC CATHETERIZATION N/A 11/30/2016   Procedure: Coronary Balloon Angioplasty;  Surgeon: Lyn Records, MD;  Location: [facility] INVASIVE CV LAB;  Service: Cardiovascular;  Laterality: N/A;  . SPINE SURGERY       Medications: Current Outpatient Prescriptions  Medication Sig Dispense Refill  . aspirin 81 MG chewable tablet Chew 1 tablet (81 mg total) by mouth daily. 30 tablet 11  . atorvastatin (LIPITOR) 80 MG tablet Take 1 tablet (80 mg total) by mouth daily at 6 PM. 30 tablet 11  . baclofen (LIORESAL) 10 MG tablet Take 20 mg by mouth 2 (two) times daily.    . carvedilol (COREG) 3.125 MG tablet Take 1 tablet (3.125 mg total) by mouth 2 (two) times daily with a meal. 60 tablet 11  . clopidogrel (PLAVIX) 75 MG tablet Take 1 tablet (75 mg total) by mouth daily with breakfast. 90 tablet 3  . HYDROcodone-acetaminophen (NORCO) 10-325 MG tablet Take 1 tablet by mouth every 6 (six) hours as needed.    . isosorbide mononitrate (IMDUR) 30 MG 24 hr tablet Take 1 tablet (30 mg total) by mouth daily. 30 tablet 6  . meloxicam (MOBIC) 15 MG tablet Take 15 mg by mouth daily.    . nicotine  (NICODERM CQ - DOSED IN MG/24 HOURS) 21 mg/24hr patch Place 1 patch (21 mg total) onto the skin daily. 28 patch 0  . nitroGLYCERIN (NITROSTAT) 0.4 MG SL tablet Place 1 tablet (0.4 mg total) under the tongue every 5 (five) minutes as needed for chest pain. 25 tablet 12  . pantoprazole (PROTONIX) 40 MG tablet Take 1 tablet (40 mg total) by mouth daily. 30 tablet 11  . pregabalin (LYRICA) 100 MG capsule Take 300 mg by mouth 3 (three) times daily.     No current facility-administered medications for this visit.     Allergies: No Known Allergies  Social History: The patient  reports that he has been smoking Cigars.  He has never used smokeless tobacco. He reports that he does not drink alcohol or use drugs.   Family History: The patient's ***family history includes CAD in his father and mother.   Review of Systems: Please see the history of present illness.   Otherwise, the review of systems is positive for {NONE DEFAULTED:18576::"none"}.   All other systems are reviewed and negative.   Physical Exam: VS:  There were no vitals taken for this visit. Marland Kitchen  BMI There is no height or weight on file to calculate BMI.  Wt Readings from Last 3 Encounters:  12/02/16 259 lb 11.2 oz (117.8 kg)    General: Pleasant. Well  developed, well nourished and in no acute distress.   HEENT: Normal.  Neck: Supple, no JVD, carotid bruits, or masses noted.  Cardiac: ***Regular rate and rhythm. No murmurs, rubs, or gallops. No edema.  Respiratory:  Lungs are clear to auscultation bilaterally with normal work of breathing.  GI: Soft and nontender.  MS: No deformity or atrophy. Gait and ROM intact.  Skin: Warm and dry. Color is normal.  Neuro:  Strength and sensation are intact and no gross focal deficits noted.  Psych: Alert, appropriate and with normal affect.   LABORATORY DATA:  EKG:  EKG {ACTION; IS/IS ZOX:09604540} ordered today. This demonstrates ***.  Lab Results  Component Value Date   WBC 11.7 (H)  12/01/2016   HGB 14.8 12/01/2016   HCT 43.6 12/01/2016   PLT 270 12/01/2016   GLUCOSE 116 (H) 12/01/2016   CHOL 218 (H) 11/30/2016   TRIG 228 (H) 11/30/2016   HDL 43 11/30/2016   LDLCALC 129 (H) 11/30/2016   ALT 25 11/30/2016   AST 29 11/30/2016   NA 135 12/01/2016   K 4.1 12/01/2016   CL 104 12/01/2016   CREATININE 1.23 12/01/2016   BUN 11 12/01/2016   CO2 24 12/01/2016   TSH 2.547 11/30/2016   INR 0.94 11/30/2016   HGBA1C 5.1 11/30/2016    BNP (last 3 results) No results for input(s): BNP in the last 8760 hours.  ProBNP (last 3 results) No results for input(s): PROBNP in the last 8760 hours.   Other Studies Reviewed Today:   Assessment/Plan:   Current medicines are reviewed with the patient today.  The patient does not have concerns regarding medicines other than what has been noted above.  The following changes have been made:  See above.  Labs/ tests ordered today include:   No orders of the defined types were placed in this encounter.    Disposition:   FU with *** in {gen number 9-81:191478} {Days to years:10300}.   Patient is agreeable to this plan and will call if any problems develop in the interim.   SignedNorma Fredrickson, NP  12/07/2016 7:47 AM  [facility] [address] [place], [place]  [address] Phone: [phone] Fax: [phone]

## 2016-12-08 NOTE — Progress Notes (Signed)
CARDIOLOGY OFFICE NOTE  Date:  12/09/2016    Keith Salinas Date of Birth: 1961/05/18 Medical Record #[id number]  PCP:  No primary care provider on file.  Cardiologist:  Keith Salinas    Chief Complaint  Patient presents with  . Coronary Artery Disease    Post hospital visit - seen for Dr. Katrinka Salinas    History of Present Illness: Keith Salinas is a 56 y.o. male who presents today for a post hospital visit. Seen for Dr. Katrinka Salinas.   He has a history of obesity, HLD, tobacco use, FH CAD at a later age.   He was admitted towards the end of January after presenting with chest pain. Taken there by EMS. EKG consistent with inferior MI - was cathed emergently.   He denies any history of diabetes, hypertension, or kidney problems. He smokes cigars, but denies alcohol or drug use. His parents both had CAD, but at a later age and died when they were in their 78s or 75s.  Emergent cath showed total occlusion of the distal one third of a moderate sized third obtuse marginal. Unfortunately, Dr. Katrinka Salinas was unable to cross the lesion successfully and therefore he was treated medically with IV heparin for 72 hours along with aspirin and Plavix. Narcotics and nitrate for pain relief. Some of his sharp chest pain may potentially be even pleuritic or pericarditic in nature based on the completed infarct. Echo showed normal EF with grade 2 DD. Off IV nitroglycerin drip. Started Imdur. Did not choose to start ACE inhibitor because of increased his beta blocker. He was initially agitated that resolved with resumption of home medication. Ambulated well. referral made to CRP II.   Comes in today. Here alone. He walked here today in the rain. He has used NTG maybe once or twice for "heart pounding". Says he has had a "sharp" pain today - not like his prior chest pain syndrome - sounds like that has NOT recurred. Overall he says he is fine. His history is a little hard to follow. He is somewhat inappropriate with his  comments. Does not stop talking. Laughs to most questions. Still smoking - trying to stop. Says he is taking his medicines and will be able to have refills. Seems limited by chronic back/leg issues. Sounds like he is followed in the pain clinic.   Past Medical History:  Diagnosis Date  . Acute ST elevation myocardial infarction (STEMI) of inferior wall (HCC) 11/30/2016  . GERD (gastroesophageal reflux disease)   . Hypercholesteremia   . Tobacco use 11/30/2016    Past Surgical History:  Procedure Laterality Date  . CARDIAC CATHETERIZATION N/A 11/30/2016   Procedure: Left Heart Cath and Coronary Angiography;  Surgeon: Keith Records, MD;  Location: [facility] INVASIVE CV LAB;  Service: Cardiovascular;  Laterality: N/A;  . CARDIAC CATHETERIZATION N/A 11/30/2016   Procedure: Coronary Balloon Angioplasty;  Surgeon: Keith Records, MD;  Location: [facility] INVASIVE CV LAB;  Service: Cardiovascular;  Laterality: N/A;  . SPINE SURGERY       Medications: Current Outpatient Prescriptions  Medication Sig Dispense Refill  . aspirin 81 MG chewable tablet Chew 1 tablet (81 mg total) by mouth daily. 30 tablet 11  . atorvastatin (LIPITOR) 80 MG tablet Take 1 tablet (80 mg total) by mouth daily at 6 PM. 30 tablet 11  . baclofen (LIORESAL) 10 MG tablet Take 20 mg by mouth 2 (two) times daily.    . carvedilol (COREG) 3.125 MG tablet Take 1 tablet (3.125 mg  total) by mouth 2 (two) times daily with a meal. 60 tablet 11  . clopidogrel (PLAVIX) 75 MG tablet Take 1 tablet (75 mg total) by mouth daily with breakfast. 90 tablet 3  . HYDROcodone-acetaminophen (NORCO) 10-325 MG tablet Take 1 tablet by mouth every 6 (six) hours as needed.    . isosorbide mononitrate (IMDUR) 30 MG 24 hr tablet Take 1 tablet (30 mg total) by mouth daily. 30 tablet 6  . meloxicam (MOBIC) 15 MG tablet Take 15 mg by mouth daily.    . nicotine (NICODERM CQ - DOSED IN MG/24 HOURS) 21 mg/24hr patch Place 1 patch (21 mg total) onto the skin daily. 28 patch 0    . nitroGLYCERIN (NITROSTAT) 0.4 MG SL tablet Place 1 tablet (0.4 mg total) under the tongue every 5 (five) minutes as needed for chest pain. 25 tablet 12  . pantoprazole (PROTONIX) 40 MG tablet Take 1 tablet (40 mg total) by mouth daily. 30 tablet 11  . pregabalin (LYRICA) 100 MG capsule Take 300 mg by mouth 3 (three) times daily.     No current facility-administered medications for this visit.     Allergies: No Known Allergies  Social History: The patient  reports that he has been smoking Cigars.  He has never used smokeless tobacco. He reports that he does not drink alcohol or use drugs.   Family History: The patient's family history includes CAD in his father and mother.   Review of Systems: Please see the history of present illness.   Otherwise, the review of systems is positive for none.   All other systems are reviewed and negative.   Physical Exam: VS:  BP 100/62   Pulse 89   Ht 5\' 10"  (1.778 m)   Wt 261 lb 12.8 oz (118.8 kg)   BMI 37.56 kg/m  .  BMI Body mass index is 37.56 kg/m.  Wt Readings from Last 3 Encounters:  12/09/16 261 lb 12.8 oz (118.8 kg)  12/02/16 259 lb 11.2 oz (117.8 kg)    General: Black male. Laughing inappropriately. He is alert and in no acute distress.   HEENT: Normal but lots of missing teeth.  Neck: Supple, no JVD, carotid bruits, or masses noted.  Cardiac: Regular rate and rhythm. No murmurs, rubs, or gallops. No edema.  Respiratory:  Lungs are clear to auscultation bilaterally with normal work of breathing.  GI: Soft and nontender.  MS: No deformity or atrophy. Gait and ROM intact.  Skin: Warm and dry. Color is normal.  Neuro:  Strength and sensation are intact and no gross focal deficits noted.  Psych: Alert, appropriate and with normal affect.   LABORATORY DATA:  EKG:  EKG is ordered today. This demonstrates NSR with diffuse ST segment elevation but improved compared to last EKG. This has been reviewed with Dr. Johney Salinas (DOD).   Lab  Results  Component Value Date   WBC 11.7 (H) 12/01/2016   HGB 14.8 12/01/2016   HCT 43.6 12/01/2016   PLT 270 12/01/2016   GLUCOSE 116 (H) 12/01/2016   CHOL 218 (H) 11/30/2016   TRIG 228 (H) 11/30/2016   HDL 43 11/30/2016   LDLCALC 129 (H) 11/30/2016   ALT 25 11/30/2016   AST 29 11/30/2016   NA 135 12/01/2016   K 4.1 12/01/2016   CL 104 12/01/2016   CREATININE 1.23 12/01/2016   BUN 11 12/01/2016   CO2 24 12/01/2016   TSH 2.547 11/30/2016   INR 0.94 11/30/2016   HGBA1C 5.1 11/30/2016  BNP (last 3 results) No results for input(s): BNP in the last 8760 hours.  ProBNP (last 3 results) No results for input(s): PROBNP in the last 8760 hours.   Other Studies Reviewed Today: Coronary Balloon Angioplasty  Left Heart Cath and Coronary Angiography  Conclusion     A stent was not successfully placed.  2nd Mrg lesion, 100 %stenosed.  Post intervention, there is a 100% residual stenosis.   Acute inferolateral infarction due to total occlusion of the distal one third of a moderate sized third obtuse marginal.  Right dominant coronary anatomy.  Widely patent LAD, left main, and remaining portion of the circumflex.  Failed angioplasty of the distal third obtuse marginal due to the inability to establish antegrade flow. See details in procedure description.  Inferoapical severe hypokinesis. EF 45-50%. Left ventricular end-diastolic pressure as recorded is inaccurate.  RECOMMENDATIONS:   Medical therapy  High intensity statin therapy  IV heparin 24 hours  Plavix and aspirin  2-D Doppler echocardiogram    Echo 12/02/16 LV EF: 55% - 60%  Study Conclusions  - Left ventricle: The cavity size was normal. Wall thickness was increased increased in a pattern of mild to moderate LVH. Systolic function was normal. The estimated ejection fraction was in the range of 55% to 60%. Wall motion was normal; there were no regional wall motion  abnormalities. Features are consistent with a pseudonormal left ventricular filling pattern, with concomitant abnormal relaxation and increased filling pressure (grade 2 diastolic dysfunction).   Assessment/Plan: 1. Inferolateral MI - not able to have successful PCI - he is managed medically. EF ok. Sounds like symptoms have improved overall. He is quite inappropriate with some of his comments, laughs inappropriately as well. Needs baseline labs today. See back in a few weeks to recheck. EKG still with diffuse ST elevation but improved from prior tracing - reviewed with Dr. Johney Salinas (DOD). BP too soft to increase his medicines.   2. HTN - BP ok on current regimen - will not support increase/titration up.  3. Tobacco abuse  4. HLD - on statin therapy.   5. Chronic pain syndrome - on narcotics.  Current medicines are reviewed with the patient today.  The patient does not have concerns regarding medicines other than what has been noted above.  The following changes have been made:  See above.  Labs/ tests ordered today include:    Orders Placed This Encounter  Procedures  . Basic metabolic panel  . CBC  . EKG 12-Lead     Disposition:   FU with Dr. Iona Beard in about 2 weeks.   Patient is agreeable to this plan and will call if any problems develop in the interim.   SignedNorma Fredrickson, NP  12/09/2016 3:57 PM  [facility] [address] [place], [place]  [address] Phone: [phone] Fax: [phone]

## 2016-12-09 ENCOUNTER — Encounter (INDEPENDENT_AMBULATORY_CARE_PROVIDER_SITE_OTHER): Payer: Self-pay

## 2016-12-09 ENCOUNTER — Encounter: Payer: Self-pay | Admitting: Nurse Practitioner

## 2016-12-09 ENCOUNTER — Ambulatory Visit (INDEPENDENT_AMBULATORY_CARE_PROVIDER_SITE_OTHER): Payer: Medicaid Other | Admitting: Nurse Practitioner

## 2016-12-09 VITALS — BP 100/62 | HR 89 | Ht 70.0 in | Wt 261.8 lb

## 2016-12-09 DIAGNOSIS — I2119 ST elevation (STEMI) myocardial infarction involving other coronary artery of inferior wall: Secondary | ICD-10-CM | POA: Diagnosis not present

## 2016-12-09 NOTE — Patient Instructions (Addendum)
We will be checking the following labs today - BMET and CBC   Medication Instructions:    Continue with your current medicines.     Testing/Procedures To Be Arranged:  N/A  Follow-Up:   See Dr. Iona Beard in 2 weeks.     Other Special Instructions:   Keep working on stopping smoking.     If you need a refill on your cardiac medications before your next appointment, please call your pharmacy.   Call the [facility] office at [phone] if you have any questions, problems or concerns.

## 2016-12-10 LAB — BASIC METABOLIC PANEL
BUN/Creatinine Ratio: 9 (ref 9–20)
BUN: 12 mg/dL (ref 6–24)
CO2: 21 mmol/L (ref 18–29)
Calcium: 9.5 mg/dL (ref 8.7–10.2)
Chloride: 96 mmol/L (ref 96–106)
Creatinine, Ser: 1.29 mg/dL — ABNORMAL HIGH (ref 0.76–1.27)
GFR calc Af Amer: 72 mL/min/{1.73_m2} (ref >59)
GFR calc non Af Amer: 62 mL/min/{1.73_m2} (ref >59)
Glucose: 113 mg/dL — ABNORMAL HIGH (ref 65–99)
Potassium: 4.6 mmol/L (ref 3.5–5.2)
Sodium: 136 mmol/L (ref 134–144)

## 2016-12-10 LAB — CBC
Hematocrit: 42.1 % (ref 37.5–51.0)
Hemoglobin: 14.4 g/dL (ref 13.0–17.7)
MCH: 34.9 pg — ABNORMAL HIGH (ref 26.6–33.0)
MCHC: 34.2 g/dL (ref 31.5–35.7)
MCV: 102 fL — ABNORMAL HIGH (ref 79–97)
Platelets: 417 10*3/uL — ABNORMAL HIGH (ref 150–379)
RBC: 4.13 x10E6/uL — ABNORMAL LOW (ref 4.14–5.80)
RDW: 13.9 % (ref 12.3–15.4)
WBC: 8.7 10*3/uL (ref 3.4–10.8)

## 2016-12-11 ENCOUNTER — Telehealth: Payer: Self-pay | Admitting: Nurse Practitioner

## 2016-12-11 NOTE — Telephone Encounter (Signed)
New message    Medication was not called in, Keith Salinas was suppose to be upping the dosage for his chest pains

## 2016-12-14 ENCOUNTER — Encounter: Payer: Self-pay | Admitting: Cardiology

## 2016-12-14 NOTE — Telephone Encounter (Signed)
There was not dose change at ov. All medications were called in as of 2/1 2018.

## 2016-12-21 ENCOUNTER — Encounter: Payer: Self-pay | Admitting: Cardiology

## 2016-12-21 ENCOUNTER — Ambulatory Visit (INDEPENDENT_AMBULATORY_CARE_PROVIDER_SITE_OTHER): Payer: Medicaid Other | Admitting: Cardiology

## 2016-12-21 VITALS — BP 132/70 | HR 104 | Ht 70.0 in | Wt 264.0 lb

## 2016-12-21 DIAGNOSIS — Z720 Tobacco use: Secondary | ICD-10-CM

## 2016-12-21 DIAGNOSIS — Z5181 Encounter for therapeutic drug level monitoring: Secondary | ICD-10-CM

## 2016-12-21 DIAGNOSIS — I2510 Atherosclerotic heart disease of native coronary artery without angina pectoris: Secondary | ICD-10-CM

## 2016-12-21 DIAGNOSIS — N183 Chronic kidney disease, stage 3 unspecified: Secondary | ICD-10-CM

## 2016-12-21 DIAGNOSIS — I2119 ST elevation (STEMI) myocardial infarction involving other coronary artery of inferior wall: Secondary | ICD-10-CM | POA: Diagnosis not present

## 2016-12-21 DIAGNOSIS — E782 Mixed hyperlipidemia: Secondary | ICD-10-CM

## 2016-12-21 MED ORDER — FUROSEMIDE 20 MG PO TABS: 20.0000 mg | ORAL_TABLET | Freq: Every day | ORAL | 6 refills | Status: DC

## 2016-12-21 MED ORDER — POTASSIUM CHLORIDE ER 10 MEQ PO TBCR: 10.0000 meq | EXTENDED_RELEASE_TABLET | Freq: Every day | ORAL | 6 refills | Status: AC

## 2016-12-21 NOTE — Progress Notes (Signed)
Cardiology Office Note   Date:  12/21/2016   ID:  Keith Salinas, DOB February 04, 1961, MRN [id number]  PCP:  No primary care provider on file.  Cardiologist:  Dr. Katrinka Blazing    Chief Complaint  Patient presents with  . Chest Pain      History of Present Illness: Keith Salinas is a 56 y.o. male who presents for EKG re-check with chest pain after inf. Lat MI with unsuccessful PCI.     He has a history of obesity, HLD, tobacco use, FH CAD at a later age.   He was admitted towards the end of January after presenting with chest pain. Taken there by EMS. EKG consistent with inferior MI - was cathed emergently.    Emergent cath showed total occlusion of the distal one third of a moderate sized third obtuse marginal. Unfortunately, Dr. Katrinka Blazing was unable to cross the lesion successfully and therefore he was treated medically with IV heparin for 72 hours along with aspirin and Plavix. Narcotics and nitrate for pain relief. Some of his sharp chest pain may potentially be even pleuritic or pericarditic in nature based on the completed infarct. Echo showed normal EF with grade 2 DD. Off IV nitroglycerin drip. StartedImdur. Did not choose to start ACE inhibitor because of increased his beta blocker.   + tobacco use. Continues to smoke. About 2 cigarettes a day.  He is wearing the nicoderm patch and he removes to smoke.  But is trying to stop.  His weight continues to climb.  He feels his abd. Is becoming larger.  BP is stable.  No further chest pain.   Past Medical History:  Diagnosis Date  . Acute ST elevation myocardial infarction (STEMI) of inferior wall (HCC) 11/30/2016  . GERD (gastroesophageal reflux disease)   . Hypercholesteremia   . Tobacco use 11/30/2016    Past Surgical History:  Procedure Laterality Date  . CARDIAC CATHETERIZATION N/A 11/30/2016   Procedure: Left Heart Cath and Coronary Angiography;  Surgeon: Lyn Records, MD;  Location: [facility] INVASIVE CV LAB;  Service: Cardiovascular;   Laterality: N/A;  . CARDIAC CATHETERIZATION N/A 11/30/2016   Procedure: Coronary Balloon Angioplasty;  Surgeon: Lyn Records, MD;  Location: [facility] INVASIVE CV LAB;  Service: Cardiovascular;  Laterality: N/A;  . SPINE SURGERY       Current Outpatient Prescriptions  Medication Sig Dispense Refill  . aspirin 81 MG chewable tablet Chew 1 tablet (81 mg total) by mouth daily. 30 tablet 11  . atorvastatin (LIPITOR) 80 MG tablet Take 1 tablet (80 mg total) by mouth daily at 6 PM. 30 tablet 11  . baclofen (LIORESAL) 10 MG tablet Take 20 mg by mouth 2 (two) times daily.    . carvedilol (COREG) 3.125 MG tablet Take 1 tablet (3.125 mg total) by mouth 2 (two) times daily with a meal. 60 tablet 11  . clopidogrel (PLAVIX) 75 MG tablet Take 1 tablet (75 mg total) by mouth daily with breakfast. 90 tablet 3  . HYDROcodone-acetaminophen (NORCO) 10-325 MG tablet Take 1 tablet by mouth every 6 (six) hours as needed.    . isosorbide mononitrate (IMDUR) 30 MG 24 hr tablet Take 1 tablet (30 mg total) by mouth daily. 30 tablet 6  . meloxicam (MOBIC) 15 MG tablet Take 15 mg by mouth daily.    . nicotine (NICODERM CQ - DOSED IN MG/24 HOURS) 21 mg/24hr patch Place 1 patch (21 mg total) onto the skin daily. 28 patch 0  . nitroGLYCERIN (NITROSTAT) 0.4 MG  SL tablet Place 1 tablet (0.4 mg total) under the tongue every 5 (five) minutes as needed for chest pain. 25 tablet 12  . OxyCODONE HCl, Abuse Deter, (OXAYDO) 5 MG TABA Take 10 mg by mouth daily.    . pantoprazole (PROTONIX) 40 MG tablet Take 1 tablet (40 mg total) by mouth daily. 30 tablet 11  . pregabalin (LYRICA) 100 MG capsule Take 300 mg by mouth 3 (three) times daily.    . furosemide (LASIX) 20 MG tablet Take 1 tablet (20 mg total) by mouth daily. 30 tablet 6  . potassium chloride (K-DUR) 10 MEQ tablet Take 1 tablet (10 mEq total) by mouth daily. 3 tablet 6   No current facility-administered medications for this visit.     Allergies:   Patient has no known  allergies.    Social History:  The patient  reports that he has been smoking Cigars.  He has never used smokeless tobacco. He reports that he does not drink alcohol or use drugs.   Family History:  The patient's family history includes CAD in his father and mother.    ROS:  General:no colds or fevers, no weight changes Skin:no rashes or ulcers HEENT:no blurred vision, no congestion CV:see HPI PUL:see HPI GI:no diarrhea constipation or melena, no indigestion GU:no hematuria, no dysuria MS:no joint pain, no claudication, + chronic back pain Neuro:no syncope, no lightheadedness Endo:no diabetes, no thyroid disease  Wt Readings from Last 3 Encounters:  12/21/16 264 lb (119.7 kg)  12/09/16 261 lb 12.8 oz (118.8 kg)  12/02/16 259 lb 11.2 oz (117.8 kg)     PHYSICAL EXAM: VS:  BP 132/70   Pulse (!) 104   Ht 5\' 10"  (1.778 m)   Wt 264 lb (119.7 kg)   BMI 37.88 kg/m  , BMI Body mass index is 37.88 kg/m. General:Pleasant affect, NAD Skin:Warm and dry, brisk capillary refill HEENT:normocephalic, sclera clear, mucus membranes moist Neck:supple, no JVD, no bruits  Heart:S1S2 RRR without murmur, gallup, rub or click Lungs:clear without rales, rhonchi, or wheezes AVW:UJWJ, non tender, + BS, do not palpate liver spleen or masses Ext:no lower ext edema, 2+ pedal pulses, 2+ radial pulses Neuro:alert and oriented X 3, MAE, follows commands, + facial symmetry    EKG:  EKG is ordered today. The ekg ordered today demonstrates sinus tach at 104 no acute changes.     Recent Labs: 11/30/2016: ALT 25; TSH 2.547 12/01/2016: Hemoglobin 14.8 12/09/2016: BUN 12; Creatinine, Ser 1.29; Platelets 417; Potassium 4.6; Sodium 136    Lipid Panel    Component Value Date/Time   CHOL 218 (H) 11/30/2016 0653   TRIG 228 (H) 11/30/2016 0653   HDL 43 11/30/2016 0653   CHOLHDL 5.1 11/30/2016 0653   VLDL 46 (H) 11/30/2016 0653   LDLCALC 129 (H) 11/30/2016 1914       Other studies  Reviewed: Additional studies/ records that were reviewed today include:  . Left Heart Cath and Coronary Angiography  Conclusion     A stent was not successfully placed.  2nd Mrg lesion, 100 %stenosed.  Post intervention, there is a 100% residual stenosis.    Acute inferolateral infarction due to total occlusion of the distal one third of a moderate sized third obtuse marginal.  Right dominant coronary anatomy.  Widely patent LAD, left main, and remaining portion of the circumflex.  Failed angioplasty of the distal third obtuse marginal due to the inability to establish antegrade flow. See details in procedure description.  Inferoapical severe hypokinesis. EF  45-50%. Left ventricular end-diastolic pressure as recorded is inaccurate.  RECOMMENDATIONS:   Medical therapy  High intensity statin therapy  IV heparin 24 hours  Plavix and aspirin  2-D Doppler echocardiogram     Echo: Study Conclusions  - Left ventricle: The cavity size was normal. Wall thickness was   increased increased in a pattern of mild to moderate LVH.   Systolic function was normal. The estimated ejection fraction was   in the range of 55% to 60%. Wall motion was normal; there were no   regional wall motion abnormalities. Features are consistent with   a pseudonormal left ventricular filling pattern, with concomitant   abnormal relaxation and increased filling pressure (grade 2   diastolic dysfunction).     ASSESSMENT AND PLAN:  1.  Recent MI unable to revascularize.  No further chest pain.   2.  CAD consists of single vessel disease 100% unable it intervene upon.  3.  Tobacco use at 2 cigarettes a day and continues to decrease.   4.  Hyperlipidemia on statin recheck lipid and hepatic on next visit.    5. HTN controlled.  6.  CKD 3    7. Increasing abd girth, he believes to be fluid.  Add lasix 20 daily and K dur 10 meq check BMP in 2 weeks.  I will see back in 1 month and Dr.  Katrinka Blazing in 3 months.        Current medicines are reviewed with the patient today.  The patient Has no concerns regarding medicines.  The following changes have been made:  See above Labs/ tests ordered today include:see above  Disposition:   FU:  see above  Signed, Nada Boozer, NP  12/21/2016 3:18 PM    [facility] [address] [place], [place], [place]  [address]/ [address] [place], [place] Phone: [phone]; Fax: [phone]  [phone]

## 2016-12-21 NOTE — Patient Instructions (Addendum)
Medication Instructions:   START TAKING LASIX 20 MG ONCE A DAY   START TAKING  K -DUR  10 MEQ ONCE D DAY   If you need a refill on your cardiac medications before your next appointment, please call your pharmacy.  Labwork: BMET IN 2 WEEKS    Testing/Procedures: NONE ORDERED  TODAY    Follow-Up:   IN ONE MONTH WITH LAURA INGOLD                         IN 3 MONTHS WITH DR Katrinka Blazing    Any Other Special Instructions Will Be Listed Below (If Applicable).

## 2016-12-29 ENCOUNTER — Ambulatory Visit: Payer: Medicaid Other | Admitting: Cardiology

## 2017-01-04 ENCOUNTER — Other Ambulatory Visit: Payer: Medicaid Other

## 2017-01-05 NOTE — Telephone Encounter (Signed)
I have no idea what meds pt is talking about.  On my visit we added lasix and K+ for 3 days for volume.  Please clarify.  He had no pain when I saw him.

## 2017-01-12 ENCOUNTER — Telehealth (HOSPITAL_COMMUNITY): Payer: Self-pay | Admitting: General Practice

## 2017-01-12 NOTE — Telephone Encounter (Signed)
Verified Medicaid insurance benefits through [employer]. Reference [phone].... KJ

## 2017-03-10 ENCOUNTER — Ambulatory Visit: Payer: Medicaid Other | Admitting: Neurology

## 2017-03-17 ENCOUNTER — Telehealth (HOSPITAL_COMMUNITY): Payer: Self-pay | Admitting: *Deleted

## 2017-03-17 ENCOUNTER — Encounter: Payer: Self-pay | Admitting: Neurology

## 2017-03-17 ENCOUNTER — Ambulatory Visit (INDEPENDENT_AMBULATORY_CARE_PROVIDER_SITE_OTHER): Payer: Medicaid Other | Admitting: Neurology

## 2017-03-17 ENCOUNTER — Telehealth (HOSPITAL_COMMUNITY): Payer: Self-pay

## 2017-03-17 ENCOUNTER — Encounter (INDEPENDENT_AMBULATORY_CARE_PROVIDER_SITE_OTHER): Payer: Self-pay

## 2017-03-17 DIAGNOSIS — M4714 Other spondylosis with myelopathy, thoracic region: Secondary | ICD-10-CM

## 2017-03-17 DIAGNOSIS — R269 Unspecified abnormalities of gait and mobility: Secondary | ICD-10-CM | POA: Diagnosis not present

## 2017-03-17 HISTORY — DX: Unspecified abnormalities of gait and mobility: R26.9

## 2017-03-17 HISTORY — DX: Other spondylosis with myelopathy, thoracic region: M47.14

## 2017-03-17 NOTE — Patient Instructions (Signed)
   We will get MRI of the brain and get physical therapy evaluation.

## 2017-03-17 NOTE — Telephone Encounter (Signed)
Received in basket from Dr. Anne Hahn that pt should hold off on cardiac rehab until the brain MRI could be completed.  Pt called and advised to hold from coming to rehab on tomorrow for orientation.  Pt is also to receive physical therapy. Will cancel pt appt for now. Alanson Aly, BSN Cardiac and Emergency planning/management officer

## 2017-03-17 NOTE — Telephone Encounter (Signed)
Updated verification Medicaid insurance benefits through [employer]. Reference [phone].... KJ

## 2017-03-17 NOTE — Progress Notes (Signed)
Reason for visit: Thoracic myelopathy  Referring physician: Dr. Antony Contras is a 56 y.o. male  History of present illness:  Keith Salinas is a 56 year old right-handed black male with a ten-year history of low back pain. 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 The patient required decompressive surgery, and he has been followed through neurosurgery at [facility] for several years. The patient has recently moved to this area and he is seeking follow-up for this issue. The patient last had MRI evaluation of the lumbar and thoracic spine in October 2016. 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 The patient has a spastic paraparesis since the surgery, he indicates that the muscle tightness in the back of the legs and up to the buttocks area has worsened over time. He will fall on occasion, he may walk with a cane or a walker. The patient has some urgency of the bladder, he may have incontinence in the evening hours. He denies any problems controlling the bowels. He does have discomfort in the lower abdomen and groin area. This has been present over the last 5 years. He has right knee arthritis as well that causes discomfort. He recently had an MI, and he is in cardiac rehabilitation for this. The patient indicates that over the last 2 and half weeks he has developed some changes in speech, he has also noted some numbness in the left face and left arm and some increased difficulty using the left arm. The patient is on baclofen taking 20 mg twice daily, and Lyrica 300 mg 3 times daily, tizanidine was also added recently taking 2 mg twice daily. The patient believes that these medications do  help his spasticity some degree, he does not stretch out on a regular basis. The patient is sent to this office for further evaluation.  Past Medical History:  Diagnosis Date  . Acute ST elevation myocardial infarction (STEMI) of inferior wall (HCC) 11/30/2016  . Gait abnormality 03/17/2017  . GERD (gastroesophageal reflux disease)   . Hypercholesteremia   . OSA (obstructive sleep apnea)   . 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 Tobacco use 11/30/2016    Past Surgical History:  Procedure Laterality Date  . CARDIAC CATHETERIZATION N/A 11/30/2016   Procedure: Left Heart Cath and Coronary Angiography;  Surgeon: Lyn Records, MD;  Location: [facility] INVASIVE CV LAB;  Service: Cardiovascular;  Laterality: N/A;  . CARDIAC CATHETERIZATION N/A 11/30/2016   Procedure: Coronary Balloon Angioplasty;  Surgeon: Lyn Records, MD;  Location: [facility] INVASIVE CV LAB;  Service: Cardiovascular;  Laterality: N/A;  . SPINE SURGERY      Family History  Problem Relation Age of Onset  . CAD Mother   . CAD Father     Social history:  reports that he has been smoking Cigars.  He has never used smokeless tobacco. He reports that he does not drink alcohol or use drugs.  Medications:  Prior to Admission medications   Medication Sig Start Date End Date Taking? Authorizing Provider  aspirin 81 MG chewable tablet Chew 1 tablet (81 mg total) by mouth daily. 12/03/16  Yes Bhagat, Bhavinkumar, PA  atorvastatin (LIPITOR) 80 MG tablet Take 1 tablet (80  mg total) by mouth daily at 6 PM. 12/02/16  Yes Bhagat, Bhavinkumar, PA  baclofen (LIORESAL) 10 MG tablet Take 20 mg by mouth 2 (two) times daily.   Yes [provider]  carvedilol (COREG) 3.125 MG tablet Take 1 tablet (3.125 mg total) by mouth 2 (two) times daily with a meal. 12/03/16  Yes Bhagat, Bhavinkumar, PA  clopidogrel (PLAVIX) 75 MG tablet Take 1 tablet (75 mg total) by mouth daily with breakfast. 12/03/16  Yes Bhagat, Bhavinkumar, PA  furosemide (LASIX) 20 MG tablet Take 1  tablet (20 mg total) by mouth daily. 12/21/16  Yes Leone Brand, NP  isosorbide mononitrate (IMDUR) 30 MG 24 hr tablet Take 1 tablet (30 mg total) by mouth daily. 12/03/16  Yes Bhagat, Bhavinkumar, PA  meloxicam (MOBIC) 15 MG tablet Take 15 mg by mouth daily.   Yes [provider]  nicotine (NICODERM CQ - DOSED IN MG/24 HOURS) 21 mg/24hr patch Place 1 patch (21 mg total) onto the skin daily. 12/03/16  Yes Bhagat, Bhavinkumar, PA  nitroGLYCERIN (NITROSTAT) 0.4 MG SL tablet Place 1 tablet (0.4 mg total) under the tongue every 5 (five) minutes as needed for chest pain. 12/02/16  Yes Bhagat, Bhavinkumar, PA  OxyCODONE HCl, Abuse Deter, (OXAYDO) 5 MG TABA Take 10 mg by mouth daily.    Yes [provider]  pantoprazole (PROTONIX) 40 MG tablet Take 1 tablet (40 mg total) by mouth daily. 12/03/16  Yes Bhagat, Bhavinkumar, PA  potassium chloride (K-DUR) 10 MEQ tablet Take 1 tablet (10 mEq total) by mouth daily. 12/21/16 03/21/17 Yes Leone Brand, NP  pregabalin (LYRICA) 100 MG capsule Take 300 mg by mouth 3 (three) times daily.   Yes [provider]  QUEtiapine (SEROQUEL) 100 MG tablet Take by mouth.   Yes [provider]      Allergies  Allergen Reactions  . Cats Claw (Uncaria Tomentosa)     ROS:  Out of a complete 14 system review of symptoms, the patient complains only of the following symptoms, and all other reviewed systems are negative.  Weight gain, fatigue Chest pain Skin rash Blurred vision Shortness of breath, snoring Constipation Feeling hot, increased thirst, flushing Joint pain, joint swelling, muscle cramps, aching muscles Allergies Memory loss, confusion, headache, numbness, weakness Depression, not enough sleep, decreased energy, change in appetite, disinterest in activities, racing thoughts Snoring, restless legs   Blood pressure 136/84, pulse 98, height 5\' 11"  (1.803 m), weight 253 lb (114.8 kg).  Physical Exam  General: The patient is  alert and cooperative at the time of the examination.The patient is moderately obese.  Eyes: Pupils are equal, round, and reactive to light. Discs are flat bilaterally.  Neck: The neck is supple, no carotid bruits are noted.  Respiratory: The respiratory examination is notable for rhonchi on the right lung fields.  Cardiovascular: The cardiovascular examination reveals a regular rate and rhythm, no obvious murmurs or rubs are noted.  Skin: Extremities are without significant edema.  Neurologic Exam  Mental status: The patient is alert and oriented x 3 at the time of the examination. The patient has apparent normal recent and remote memory, with an apparently normal attention span and concentration ability.  Cranial nerves: Facial symmetry is present. There is good sensation of the face to pinprick and soft touch on the right, decreased on the left lower face. The strength of the facial muscles and the muscles to head turning and shoulder shrug are normal bilaterally. Speech is well enunciated, no  aphasia or dysarthria is noted. Extraocular movements are full. Visual fields are full. The tongue is midline, and the patient has symmetric elevation of the soft palate. No obvious hearing deficits are noted.  Motor: The motor testing reveals 5 over 5 strength of  the upper extremities.  The patient has 4/5 strength with hip flexion bilaterally, the patient is having difficulty with dorsiflexion of the feet on both sides, he has difficulty with extension at the knee on the left more so than the right. Motor tone is increased in both lower extremities.  Sensory: Sensory testing is notable for a decrease in pinprick sensation on the left arm as compared to the right. Vibration sensation position sense are symmetric in both arms. With the lower extremities, pinprick sensation is decreased but symmetric, vibration sensation and position sensation are severely depressed in both feet. With double simultaneous  stimulation, the patient appears to extinct on the left arm and leg. There appears to be a sensory level up to the T5 level on the back to pinprick.  Coordination: Cerebellar testing reveals good finger-nose-finger and heel-to-shin bilaterally.  Gait and station: Gait is wide-based, unsteady. The patient uses a cane for ambulation. Tandem gait was not attempted. Romberg is negative, but is unsteady.  Reflexes: Deep tendon reflexes are symmetric. Toes are equivocal on the left, upgoing on the right.   Assessment/Plan:  1. Lower thoracic myelopathy   2. Gait disorder   3. Onset of the face and arm numbness, speech disturbance   The patient does have risk factors for cerebrovascular disease with a recent myocardial infarction, and a history of sleep apnea. The patient has noted onset of left face and arm numbness, some speech disturbance over the last 2 and half weeks. The patient will be sent for MRI of the brain, he will be sent for physical therapy for gait training, and to help him develop a regimen for regular stretching of the legs to help reduce spasticity. In the future, we will repeat the MRI of the low back and thoracic spine. The patient has recently been placed on tizanidine, the doses of tizanidine and baclofen can be adjusted to help improve the spasticity. He will follow-up in 3 months.   Marlan Palau MD 03/17/2017 8:47 AM  [facility] [address] [place], [place] [address]  Phone [phone] Fax [phone]

## 2017-03-18 ENCOUNTER — Inpatient Hospital Stay (HOSPITAL_COMMUNITY): Admission: RE | Admit: 2017-03-18 | Payer: Medicaid Other | Source: Ambulatory Visit

## 2017-03-20 NOTE — Progress Notes (Deleted)
Cardiology Office Note    Date:  03/20/2017   ID:  Keith Salinas, DOB 1961-09-07, MRN [id number]  PCP:  Gilda Crease, MD  Cardiologist: Lesleigh Noe, MD   No chief complaint on file.   History of Present Illness:  Keith Salinas is a 56 y.o. male with recent inferolateral STEMI 11/2016.    Past Medical History:  Diagnosis Date  . Acute ST elevation myocardial infarction (STEMI) of inferior wall (HCC) 11/30/2016  . Gait abnormality 03/17/2017  . GERD (gastroesophageal reflux disease)   . Hypercholesteremia   . OSA (obstructive sleep apnea)   . 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 Tobacco use 11/30/2016    Past Surgical History:  Procedure Laterality Date  . CARDIAC CATHETERIZATION N/A 11/30/2016   Procedure: Left Heart Cath and Coronary Angiography;  Surgeon: Lyn Records, MD;  Location: [facility] INVASIVE CV LAB;  Service: Cardiovascular;  Laterality: N/A;  . CARDIAC CATHETERIZATION N/A 11/30/2016   Procedure: Coronary Balloon Angioplasty;  Surgeon: Lyn Records, MD;  Location: [facility] INVASIVE CV LAB;  Service: Cardiovascular;  Laterality: N/A;  . SPINE SURGERY      Current Medications: Outpatient Medications Prior to Visit  Medication Sig Dispense Refill  . aspirin 81 MG chewable tablet Chew 1 tablet (81 mg total) by mouth daily. 30 tablet 11  . atorvastatin (LIPITOR) 80 MG tablet Take 1 tablet (80 mg total) by mouth daily at 6 PM. 30 tablet 11  . baclofen (LIORESAL) 10 MG tablet Take 20 mg by mouth 2 (two) times daily.    . carvedilol (COREG) 3.125 MG tablet Take 1 tablet (3.125 mg total) by mouth 2 (two) times daily with a meal. 60 tablet 11  . clopidogrel (PLAVIX) 75 MG tablet Take 1 tablet (75 mg total) by mouth daily with breakfast. 90 tablet 3  . furosemide (LASIX) 20 MG tablet Take 1 tablet (20 mg total) by mouth daily. 30 tablet 6  . isosorbide mononitrate (IMDUR) 30 MG 24 hr tablet Take 1 tablet (30 mg total) by mouth daily. 30 tablet 6  . meloxicam (MOBIC) 15 MG  tablet Take 15 mg by mouth daily.    . nicotine (NICODERM CQ - DOSED IN MG/24 HOURS) 21 mg/24hr patch Place 1 patch (21 mg total) onto the skin daily. 28 patch 0  . nitroGLYCERIN (NITROSTAT) 0.4 MG SL tablet Place 1 tablet (0.4 mg total) under the tongue every 5 (five) minutes as needed for chest pain. 25 tablet 12  . OxyCODONE HCl, Abuse Deter, (OXAYDO) 5 MG TABA Take 10 mg by mouth daily.     . pantoprazole (PROTONIX) 40 MG tablet Take 1 tablet (40 mg total) by mouth daily. 30 tablet 11  . potassium chloride (K-DUR) 10 MEQ tablet Take 1 tablet (10 mEq total) by mouth daily. 3 tablet 6  . pregabalin (LYRICA) 100 MG capsule Take 300 mg by mouth 3 (three) times daily.    . QUEtiapine (SEROQUEL) 100 MG tablet Take by mouth.    Marland Kitchen tiZANidine (ZANAFLEX) 2 MG tablet Take 2 mg by mouth 2 (two) times daily.     No facility-administered medications prior to visit.      Allergies:   Cats claw (uncaria tomentosa)   Social History   Social History  . Marital status: Single    Spouse name: N/A  . Number of children: N/A  . Years of education: N/A   Occupational History  . Disabled    Social History Main Topics  . Smoking  status: Current Every Day Smoker    Types: Cigars  . Smokeless tobacco: Never Used  . Alcohol use No  . Drug use: No  . Sexual activity: Not on file   Other Topics Concern  . Not on file   Social History Narrative  . No narrative on file     Family History:  The patient's ***family history includes CAD in his father and mother.   ROS:   Please see the history of present illness.    ***  All other systems reviewed and are negative.   PHYSICAL EXAM:   VS:  There were no vitals taken for this visit.   GEN: Well nourished, well developed, in no acute distress  HEENT: normal  Neck: no JVD, carotid bruits, or masses Cardiac: ***RRR; no murmurs, rubs, or gallops,no edema  Respiratory:  clear to auscultation bilaterally, normal work of breathing GI: soft,  nontender, nondistended, + BS MS: no deformity or atrophy  Skin: warm and dry, no rash Neuro:  Alert and Oriented x 3, Strength and sensation are intact Psych: euthymic mood, full affect  Wt Readings from Last 3 Encounters:  03/17/17 253 lb (114.8 kg)  12/21/16 264 lb (119.7 kg)  12/09/16 261 lb 12.8 oz (118.8 kg)      Studies/Labs Reviewed:   EKG:  EKG  ***  Recent Labs: 11/30/2016: ALT 25; TSH 2.547 12/01/2016: Hemoglobin 14.8 12/09/2016: BUN 12; Creatinine, Ser 1.29; Platelets 417; Potassium 4.6; Sodium 136   Lipid Panel    Component Value Date/Time   CHOL 218 (H) 11/30/2016 0653   TRIG 228 (H) 11/30/2016 0653   HDL 43 11/30/2016 0653   CHOLHDL 5.1 11/30/2016 0653   VLDL 46 (H) 11/30/2016 0653   LDLCALC 129 (H) 11/30/2016 1610    Additional studies/ records that were reviewed today include:    CORONARY ANGIOGRAPHY 2018  Post-Intervention Diagram         ASSESSMENT:    1. Occlusive coronary artery disease requiring drug therapy - occluded OM 3. Unable to revascularize   2. 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 Dyslipidemia, goal LDL below 70      PLAN:  In order of problems listed above:  1. ***    Medication Adjustments/Labs and Tests Ordered: Current medicines are reviewed at length with the patient today.  Concerns regarding medicines are outlined above.  Medication changes, Labs and Tests ordered today are listed in the Patient Instructions below. There are no Patient Instructions on file for this visit.   Signed, Lesleigh Noe, MD  03/20/2017 1:07 PM    [facility] [address] [place], [place], [place]  [address] Phone: [phone]; Fax: [phone]

## 2017-03-22 ENCOUNTER — Ambulatory Visit: Payer: Medicaid Other | Admitting: Interventional Cardiology

## 2017-03-24 ENCOUNTER — Ambulatory Visit (HOSPITAL_COMMUNITY): Payer: Medicaid Other

## 2017-03-26 ENCOUNTER — Ambulatory Visit (HOSPITAL_COMMUNITY): Payer: Medicaid Other

## 2017-03-31 ENCOUNTER — Ambulatory Visit (HOSPITAL_COMMUNITY): Payer: Medicaid Other

## 2017-04-02 ENCOUNTER — Ambulatory Visit (HOSPITAL_COMMUNITY): Payer: Medicaid Other

## 2017-04-05 ENCOUNTER — Ambulatory Visit (HOSPITAL_COMMUNITY): Payer: Medicaid Other

## 2017-04-05 ENCOUNTER — Other Ambulatory Visit: Payer: Medicaid Other

## 2017-04-07 ENCOUNTER — Ambulatory Visit (HOSPITAL_COMMUNITY): Payer: Medicaid Other

## 2017-04-09 ENCOUNTER — Ambulatory Visit (HOSPITAL_COMMUNITY): Payer: Medicaid Other

## 2017-04-12 ENCOUNTER — Ambulatory Visit (HOSPITAL_COMMUNITY): Payer: Medicaid Other

## 2017-04-14 ENCOUNTER — Ambulatory Visit (HOSPITAL_COMMUNITY): Payer: Medicaid Other

## 2017-04-16 ENCOUNTER — Other Ambulatory Visit: Payer: Medicaid Other

## 2017-04-16 ENCOUNTER — Ambulatory Visit (HOSPITAL_COMMUNITY): Payer: Medicaid Other

## 2017-04-19 ENCOUNTER — Ambulatory Visit (HOSPITAL_COMMUNITY): Payer: Medicaid Other

## 2017-04-21 ENCOUNTER — Encounter: Payer: Self-pay | Admitting: Interventional Cardiology

## 2017-04-21 ENCOUNTER — Ambulatory Visit (HOSPITAL_COMMUNITY): Payer: Medicaid Other

## 2017-04-23 ENCOUNTER — Ambulatory Visit (HOSPITAL_COMMUNITY): Payer: Medicaid Other

## 2017-04-26 ENCOUNTER — Ambulatory Visit (HOSPITAL_COMMUNITY): Payer: Medicaid Other

## 2017-04-27 ENCOUNTER — Other Ambulatory Visit: Payer: Medicaid Other

## 2017-04-28 ENCOUNTER — Ambulatory Visit (HOSPITAL_COMMUNITY): Payer: Medicaid Other

## 2017-04-30 ENCOUNTER — Ambulatory Visit (HOSPITAL_COMMUNITY): Payer: Medicaid Other

## 2017-05-03 ENCOUNTER — Ambulatory Visit (HOSPITAL_COMMUNITY): Payer: Medicaid Other

## 2017-05-07 ENCOUNTER — Ambulatory Visit (HOSPITAL_COMMUNITY): Payer: Medicaid Other

## 2017-05-07 ENCOUNTER — Ambulatory Visit: Payer: Medicaid Other | Admitting: Interventional Cardiology

## 2017-05-10 ENCOUNTER — Ambulatory Visit (HOSPITAL_COMMUNITY): Payer: Medicaid Other

## 2017-05-12 ENCOUNTER — Ambulatory Visit (HOSPITAL_COMMUNITY): Payer: Medicaid Other

## 2017-05-14 ENCOUNTER — Ambulatory Visit (HOSPITAL_COMMUNITY): Payer: Medicaid Other

## 2017-05-17 ENCOUNTER — Ambulatory Visit (HOSPITAL_COMMUNITY): Payer: Medicaid Other

## 2017-05-19 ENCOUNTER — Ambulatory Visit (HOSPITAL_COMMUNITY): Payer: Medicaid Other

## 2017-05-21 ENCOUNTER — Ambulatory Visit (HOSPITAL_COMMUNITY): Payer: Medicaid Other

## 2017-05-24 ENCOUNTER — Ambulatory Visit (HOSPITAL_COMMUNITY): Payer: Medicaid Other

## 2017-05-26 ENCOUNTER — Ambulatory Visit (HOSPITAL_COMMUNITY): Payer: Medicaid Other

## 2017-05-28 ENCOUNTER — Ambulatory Visit (HOSPITAL_COMMUNITY): Payer: Medicaid Other

## 2017-05-31 ENCOUNTER — Ambulatory Visit (HOSPITAL_COMMUNITY): Payer: Medicaid Other

## 2017-06-02 ENCOUNTER — Ambulatory Visit (HOSPITAL_COMMUNITY): Payer: Medicaid Other

## 2017-06-04 ENCOUNTER — Ambulatory Visit (HOSPITAL_COMMUNITY): Payer: Medicaid Other

## 2017-06-07 ENCOUNTER — Ambulatory Visit (HOSPITAL_COMMUNITY): Payer: Medicaid Other

## 2017-06-09 ENCOUNTER — Ambulatory Visit (HOSPITAL_COMMUNITY): Payer: Medicaid Other

## 2017-06-11 ENCOUNTER — Ambulatory Visit (HOSPITAL_COMMUNITY): Payer: Medicaid Other

## 2017-06-21 ENCOUNTER — Ambulatory Visit: Payer: Medicaid Other | Admitting: Neurology

## 2017-06-21 ENCOUNTER — Telehealth: Payer: Self-pay | Admitting: Neurology

## 2017-06-21 NOTE — Telephone Encounter (Signed)
This patient did not show for a revisit appointment today, canceled same day of appointment.

## 2017-06-22 ENCOUNTER — Encounter: Payer: Self-pay | Admitting: Neurology

## 2017-10-01 ENCOUNTER — Other Ambulatory Visit (HOSPITAL_COMMUNITY): Payer: Self-pay | Admitting: Specialist

## 2017-10-01 ENCOUNTER — Encounter (HOSPITAL_COMMUNITY): Payer: Medicaid Other

## 2017-10-01 ENCOUNTER — Inpatient Hospital Stay (HOSPITAL_COMMUNITY): Admission: RE | Admit: 2017-10-01 | Payer: Medicaid Other | Source: Ambulatory Visit

## 2017-10-01 DIAGNOSIS — I6529 Occlusion and stenosis of unspecified carotid artery: Secondary | ICD-10-CM

## 2017-10-01 DIAGNOSIS — I824Z3 Acute embolism and thrombosis of unspecified deep veins of distal lower extremity, bilateral: Secondary | ICD-10-CM

## 2017-10-14 ENCOUNTER — Telehealth: Payer: Self-pay | Admitting: Family Medicine

## 2017-10-14 ENCOUNTER — Ambulatory Visit: Payer: Medicaid Other | Attending: Family Medicine | Admitting: Family Medicine

## 2017-10-14 ENCOUNTER — Encounter: Payer: Self-pay | Admitting: Family Medicine

## 2017-10-14 VITALS — BP 113/74 | HR 85 | Temp 98.0°F | Resp 18 | Ht 70.0 in | Wt 263.4 lb

## 2017-10-14 DIAGNOSIS — Z79899 Other long term (current) drug therapy: Secondary | ICD-10-CM | POA: Diagnosis not present

## 2017-10-14 DIAGNOSIS — I2510 Atherosclerotic heart disease of native coronary artery without angina pectoris: Secondary | ICD-10-CM | POA: Insufficient documentation

## 2017-10-14 DIAGNOSIS — I110 Hypertensive heart disease with heart failure: Secondary | ICD-10-CM | POA: Insufficient documentation

## 2017-10-14 DIAGNOSIS — Z7902 Long term (current) use of antithrombotics/antiplatelets: Secondary | ICD-10-CM | POA: Insufficient documentation

## 2017-10-14 DIAGNOSIS — Z9119 Patient's noncompliance with other medical treatment and regimen: Secondary | ICD-10-CM | POA: Insufficient documentation

## 2017-10-14 DIAGNOSIS — E785 Hyperlipidemia, unspecified: Secondary | ICD-10-CM | POA: Insufficient documentation

## 2017-10-14 DIAGNOSIS — Z6837 Body mass index (BMI) 37.0-37.9, adult: Secondary | ICD-10-CM | POA: Diagnosis not present

## 2017-10-14 DIAGNOSIS — Z9111 Patient's noncompliance with dietary regimen: Secondary | ICD-10-CM | POA: Insufficient documentation

## 2017-10-14 DIAGNOSIS — Z7689 Persons encountering health services in other specified circumstances: Secondary | ICD-10-CM | POA: Insufficient documentation

## 2017-10-14 DIAGNOSIS — M545 Low back pain: Secondary | ICD-10-CM | POA: Insufficient documentation

## 2017-10-14 DIAGNOSIS — I252 Old myocardial infarction: Secondary | ICD-10-CM

## 2017-10-14 DIAGNOSIS — E669 Obesity, unspecified: Secondary | ICD-10-CM | POA: Diagnosis not present

## 2017-10-14 DIAGNOSIS — F17200 Nicotine dependence, unspecified, uncomplicated: Secondary | ICD-10-CM | POA: Insufficient documentation

## 2017-10-14 DIAGNOSIS — G8929 Other chronic pain: Secondary | ICD-10-CM | POA: Insufficient documentation

## 2017-10-14 DIAGNOSIS — I5030 Unspecified diastolic (congestive) heart failure: Secondary | ICD-10-CM | POA: Insufficient documentation

## 2017-10-14 DIAGNOSIS — Z7982 Long term (current) use of aspirin: Secondary | ICD-10-CM | POA: Insufficient documentation

## 2017-10-14 MED ORDER — CLOPIDOGREL BISULFATE 75 MG PO TABS: 75.0000 mg | ORAL_TABLET | Freq: Every day | ORAL | 3 refills | Status: DC

## 2017-10-14 MED ORDER — ASPIRIN 81 MG PO CHEW: 81.0000 mg | CHEWABLE_TABLET | Freq: Every day | ORAL | 11 refills | Status: DC

## 2017-10-14 MED ORDER — FUROSEMIDE 20 MG PO TABS: 20.0000 mg | ORAL_TABLET | Freq: Every day | ORAL | 6 refills | Status: DC

## 2017-10-14 MED ORDER — ATORVASTATIN CALCIUM 80 MG PO TABS: 80.0000 mg | ORAL_TABLET | Freq: Every day | ORAL | 2 refills | Status: DC

## 2017-10-14 MED ORDER — BUPROPION HCL ER (SR) 150 MG PO TB12: 150.0000 mg | ORAL_TABLET | Freq: Two times a day (BID) | ORAL | 2 refills | Status: DC

## 2017-10-14 MED ORDER — CARVEDILOL 3.125 MG PO TABS: 3.1250 mg | ORAL_TABLET | Freq: Two times a day (BID) | ORAL | 11 refills | Status: DC

## 2017-10-14 MED ORDER — ATORVASTATIN CALCIUM 80 MG PO TABS: 80.0000 mg | ORAL_TABLET | Freq: Every day | ORAL | 11 refills | Status: DC

## 2017-10-14 MED ORDER — CARVEDILOL 3.125 MG PO TABS: 3.1250 mg | ORAL_TABLET | Freq: Two times a day (BID) | ORAL | 2 refills | Status: DC

## 2017-10-14 NOTE — Progress Notes (Signed)
Patient is here for estab care   Patient complains knee pain

## 2017-10-14 NOTE — Patient Instructions (Signed)

## 2017-10-14 NOTE — Progress Notes (Signed)
Subjective:  Patient ID: Keith Salinas, male    DOB: [birth year]  Age: 56 y.o. MRN: [id number]  CC: Establish Care   HPI Keith Salinas is a 56 year old male presents to establish care.  Past medical history includes inferior MI with unsuccessful PCI, hypertension, hyperlipidemia, current smoker, obesity, and chronic lower back pain. HTN:  He is not exercising and is not adherent to low salt diet.  He does not check BP at home. Cardiac symptoms none. Patient denies chest pain, chest pressure/discomfort, claudication, lower extremity edema, near-syncope, palpitations and syncope.  Cardiovascular risk factors: advanced age (older than 60 for men, 78 for women), dyslipidemia, hypertension, male gender, obesity (BMI >= 30 kg/m2), sedentary lifestyle and smoking/ tobacco exposure.  He expresses the desire to quit smoking and requests medication to help him quit.  Use of agents associated with hypertension: none. History of target organ damage: angina/ prior myocardial infarction and heart failure.  History of thoracic myelopathy and thoracic stenosis.  He ambulates with the assistance of a walker.  He is being followed by neurology.  Patient appears anxious he is agreeable to speaking with LCSW at this time.  Outpatient Medications Prior to Visit  Medication Sig Dispense Refill  . baclofen (LIORESAL) 10 MG tablet Take 20 mg by mouth 2 (two) times daily.    . meloxicam (MOBIC) 15 MG tablet Take 15 mg by mouth daily.    . nicotine (NICODERM CQ - DOSED IN MG/24 HOURS) 21 mg/24hr patch Place 1 patch (21 mg total) onto the skin daily. 28 patch 0  . nitroGLYCERIN (NITROSTAT) 0.4 MG SL tablet Place 1 tablet (0.4 mg total) under the tongue every 5 (five) minutes as needed for chest pain. 25 tablet 12  . OxyCODONE HCl, Abuse Deter, (OXAYDO) 5 MG TABA Take 10 mg by mouth daily.     . pantoprazole (PROTONIX) 40 MG tablet Take 1 tablet (40 mg total) by mouth daily. 30 tablet 11  . pregabalin (LYRICA) 100 MG capsule  Take 300 mg by mouth 3 (three) times daily.    . QUEtiapine (SEROQUEL) 100 MG tablet Take by mouth.    Marland Kitchen tiZANidine (ZANAFLEX) 2 MG tablet Take 2 mg by mouth 2 (two) times daily.    Marland Kitchen aspirin 81 MG chewable tablet Chew 1 tablet (81 mg total) by mouth daily. 30 tablet 11  . atorvastatin (LIPITOR) 80 MG tablet Take 1 tablet (80 mg total) by mouth daily at 6 PM. 30 tablet 11  . carvedilol (COREG) 3.125 MG tablet Take 1 tablet (3.125 mg total) by mouth 2 (two) times daily with a meal. 60 tablet 11  . clopidogrel (PLAVIX) 75 MG tablet Take 1 tablet (75 mg total) by mouth daily with breakfast. 90 tablet 3  . furosemide (LASIX) 20 MG tablet Take 1 tablet (20 mg total) by mouth daily. 30 tablet 6  . isosorbide mononitrate (IMDUR) 30 MG 24 hr tablet Take 1 tablet (30 mg total) by mouth daily. 30 tablet 6  . potassium chloride (K-DUR) 10 MEQ tablet Take 1 tablet (10 mEq total) by mouth daily. 3 tablet 6   No facility-administered medications prior to visit.     ROS Review of Systems  Constitutional: Negative.   Respiratory: Negative.   Cardiovascular: Negative.   Musculoskeletal: Positive for back pain (chronic).  Skin: Negative.   Psychiatric/Behavioral: Negative for suicidal ideas.   Objective:  BP 113/74 (BP Location: Left Arm, Patient Position: Sitting, Cuff Size: Normal)   Pulse 85   Temp 98  F (36.7 C) (Oral)   Resp 18   Ht 5\' 10"  (1.778 m)   Wt 263 lb 6.4 oz (119.5 kg)   SpO2 95%   BMI 37.79 kg/m   BP/Weight 10/14/2017 03/17/2017 12/21/2016  Systolic BP 113 136 132  Diastolic BP 74 84 70  Wt. (Lbs) 263.4 253 264  BMI 37.79 35.29 37.88     Physical Exam  Eyes: Conjunctivae are normal. Pupils are equal, round, and reactive to light.  Neck: No JVD present.  Cardiovascular: Normal rate, regular rhythm, normal heart sounds and intact distal pulses.  Pulmonary/Chest: Effort normal and breath sounds normal.  Abdominal: Soft. Bowel sounds are normal.  Skin: Skin is warm and dry.    Psychiatric: His mood appears anxious. His speech is tangential. He expresses impulsivity. He expresses no homicidal and no suicidal ideation. He expresses no suicidal plans and no homicidal plans.  Nursing note and vitals reviewed.    Assessment & Plan:   1. Encounter to establish care Anxious appearence, given LCSW contact information for follow-up.  2. Diastolic CHF with preserved left ventricular function, NYHA class 2 (HCC) carvedilol (COREG) 3.125 MG tablet; Take 1 tablet (3.125 mg total) by mouth 2 (two) times daily with a meal.  furosemide (LASIX) 20 MG tablet; Take 1 tablet (20 mg total) by mouth daily. - Ambulatory referral to Cardiology  3. Dyslipidemia -atorvastatin (LIPITOR) 80 MG tablet; Take 1 tablet (80 mg total) by mouth daily at 6 PM - CMP and Liver - Lipid Panel  4. Ready to quit smoking -buPROPion (WELLBUTRIN SR) 150 MG 12 hr tablet  5. Occlusive coronary artery disease requiring drug therapy - occluded OM 3. Unable to revascularize  - Ambulatory referral to Cardiology  6. Old MI (myocardial infarction)- Inf STEMI  - Ambulatory referral to Cardiology -aspirin 81 MG chewable tablet; Chew 1 tablet (81 mg total) by mouth daily. - clopidogrel (PLAVIX) 75 MG tablet; Take 1 tablet (75 mg total) by mouth daily with breakfast.  Dispense: 90 tablet; Refill: 3   Follow-up: Return in about 8 weeks (around 12/09/2017) for HTN.   Lizbeth BarkMandesia R Chrishana Spargur FNP

## 2017-10-14 NOTE — Telephone Encounter (Signed)
Patient called and said that the [facility] on [address] [place] doesn't have the medication. Please fu with patient thank you

## 2017-10-15 MED ORDER — FUROSEMIDE 20 MG PO TABS: 20.0000 mg | ORAL_TABLET | Freq: Every day | ORAL | 6 refills | Status: AC

## 2017-10-15 MED ORDER — ATORVASTATIN CALCIUM 80 MG PO TABS: 80.0000 mg | ORAL_TABLET | Freq: Every day | ORAL | 2 refills | Status: DC

## 2017-10-15 MED ORDER — BUPROPION HCL ER (SR) 150 MG PO TB12: 150.0000 mg | ORAL_TABLET | Freq: Two times a day (BID) | ORAL | 2 refills | Status: AC

## 2017-10-15 MED ORDER — ASPIRIN 81 MG PO CHEW: 81.0000 mg | CHEWABLE_TABLET | Freq: Every day | ORAL | 11 refills | Status: AC

## 2017-10-15 MED ORDER — CLOPIDOGREL BISULFATE 75 MG PO TABS: 75.0000 mg | ORAL_TABLET | Freq: Every day | ORAL | 3 refills | Status: DC

## 2017-10-15 MED ORDER — CARVEDILOL 3.125 MG PO TABS: 3.1250 mg | ORAL_TABLET | Freq: Two times a day (BID) | ORAL | 2 refills | Status: AC

## 2017-10-15 NOTE — Telephone Encounter (Signed)
Resent prescriptions.

## 2017-10-18 DIAGNOSIS — M25531 Pain in right wrist: Secondary | ICD-10-CM | POA: Insufficient documentation

## 2017-10-22 ENCOUNTER — Ambulatory Visit (HOSPITAL_COMMUNITY)
Admission: RE | Admit: 2017-10-22 | Discharge: 2017-10-22 | Disposition: A | Discharge status: Home / Self Care | Payer: Medicaid Other | Source: Ambulatory Visit | Attending: Specialist | Admitting: Specialist

## 2017-10-22 ENCOUNTER — Ambulatory Visit (HOSPITAL_COMMUNITY)
Admission: RE | Admit: 2017-10-22 | Discharge: 2017-10-22 | Disposition: A | Discharge status: Home / Self Care | Payer: Medicaid Other | Source: Ambulatory Visit | Attending: Vascular Surgery | Admitting: Vascular Surgery

## 2017-10-22 DIAGNOSIS — I6529 Occlusion and stenosis of unspecified carotid artery: Secondary | ICD-10-CM | POA: Insufficient documentation

## 2017-10-22 DIAGNOSIS — I824Z3 Acute embolism and thrombosis of unspecified deep veins of distal lower extremity, bilateral: Secondary | ICD-10-CM | POA: Diagnosis not present

## 2017-10-29 ENCOUNTER — Telehealth: Payer: Self-pay | Admitting: Family Medicine

## 2017-10-29 NOTE — Telephone Encounter (Signed)
Pt called since he went to the [facility] and was told the him to call us to see if you can auth the Pain Dr Okey Dupre Cristal Deer to prescribe this medication to the Pt, he need and auth to be fax to his office at [phone] pregabalin (LYRICA) 100 MG capsule Ibeprohen 800 mg baclofen (LIORESAL) 10 MG tablet Please follow up

## 2017-11-05 ENCOUNTER — Ambulatory Visit: Payer: Medicaid Other | Admitting: Internal Medicine

## 2017-11-05 ENCOUNTER — Other Ambulatory Visit: Payer: Self-pay | Admitting: Family Medicine

## 2017-11-05 NOTE — Progress Notes (Deleted)
   Follow-up Outpatient Visit Date: 11/05/2017  Primary Care Provider: Lizbeth Bark, FNP [address] [place] [address]  Chief Complaint: ***  HPI:  Mr. Keith Salinas is a 57 y.o. year-old male with history of ***, who presents for follow-up of ***.  --------------------------------------------------------------------------------------------------  ***  Recent CV Pertinent Labs: Lab Results  Component Value Date   CHOL 218 (H) 11/30/2016   HDL 43 11/30/2016   LDLCALC 129 (H) 11/30/2016   TRIG 228 (H) 11/30/2016   CHOLHDL 5.1 11/30/2016   INR 0.94 11/30/2016   K 4.6 12/09/2016   BUN 12 12/09/2016   CREATININE 1.29 (H) 12/09/2016    Past medical and surgical history were reviewed and updated in EPIC.  No outpatient medications have been marked as taking for the 11/05/17 encounter (Appointment) with Lillyanne Bradburn, Cristal Deer, MD.    Allergies: Cats claw (uncaria tomentosa)  Social History   Socioeconomic History  . Marital status: Single    Spouse name: Not on file  . Number of children: Not on file  . Years of education: Not on file  . Highest education level: Not on file  Social Needs  . Financial resource strain: Not on file  . Food insecurity - worry: Not on file  . Food insecurity - inability: Not on file  . Transportation needs - medical: Not on file  . Transportation needs - non-medical: Not on file  Occupational History  . Occupation: Disabled  Tobacco Use  . Smoking status: Current Every Day Smoker    Types: Cigars  . Smokeless tobacco: Never Used  Substance and Sexual Activity  . Alcohol use: No  . Drug use: No  . Sexual activity: Not on file  Other Topics Concern  . Not on file  Social History Narrative  . Not on file    Family History  Problem Relation Age of Onset  . CAD Mother   . CAD Father     Review of Systems: A 12-system review of systems was performed and was negative except as noted in the  HPI.  --------------------------------------------------------------------------------------------------  Physical Exam: There were no vitals taken for this visit.  General:  *** HEENT: No conjunctival pallor or scleral icterus. Moist mucous membranes.  OP clear. Neck: Supple without lymphadenopathy, thyromegaly, JVD, or HJR. No carotid bruit. Lungs: Normal work of breathing. Clear to auscultation bilaterally without wheezes or crackles. Heart: Regular rate and rhythm without murmurs, rubs, or gallops. Non-displaced PMI. Abd: Bowel sounds present. Soft, NT/ND without hepatosplenomegaly Ext: No lower extremity edema. Radial, PT, and DP pulses are 2+ bilaterally. Skin: Warm and dry without rash.  EKG:  ***  Lab Results  Component Value Date   WBC 8.7 12/09/2016   HGB 14.4 12/09/2016   HCT 42.1 12/09/2016   MCV 102 (H) 12/09/2016   PLT 417 (H) 12/09/2016    Lab Results  Component Value Date   NA 136 12/09/2016   K 4.6 12/09/2016   CL 96 12/09/2016   CO2 21 12/09/2016   BUN 12 12/09/2016   CREATININE 1.29 (H) 12/09/2016   GLUCOSE 113 (H) 12/09/2016   ALT 25 11/30/2016    Lab Results  Component Value Date   CHOL 218 (H) 11/30/2016   HDL 43 11/30/2016   LDLCALC 129 (H) 11/30/2016   TRIG 228 (H) 11/30/2016   CHOLHDL 5.1 11/30/2016    --------------------------------------------------------------------------------------------------  ASSESSMENT AND PLAN: Yvonne Kendall, MD 11/05/2017 7:39 AM

## 2017-11-05 NOTE — Telephone Encounter (Signed)
MA contacted the office and a message was taken to relay to the provider.

## 2017-11-05 NOTE — Telephone Encounter (Signed)
Returned phone call regarding patient's previous medications requests. Per Dr.Chrisp patient's medication requests will be addressed at patient's upcoming follow up on Monday.

## 2017-11-05 NOTE — Telephone Encounter (Signed)
Called and spoke with patient yesterday afternoon. He reports pain clinic MD is requesting PCP authorize pregabalin (LYRICA) 100 MG capsule, baclofen (LIORESAL) 10 MG tablet, and ibuprofen 800 MG. Contact information was requested from patient. Per patient contact information is : Dr. Georga Hacking, MD. Clinic is located [address], [address] Phone # is [phone]. Patient is being managed by pain medication why is authorization requested for PCP?

## 2017-11-08 ENCOUNTER — Ambulatory Visit: Payer: Medicaid Other | Admitting: Internal Medicine

## 2017-11-08 ENCOUNTER — Encounter: Payer: Self-pay | Admitting: *Deleted

## 2017-12-14 ENCOUNTER — Encounter: Payer: Self-pay | Admitting: Interventional Cardiology

## 2017-12-14 ENCOUNTER — Ambulatory Visit: Payer: Medicaid Other | Admitting: Internal Medicine

## 2017-12-14 ENCOUNTER — Ambulatory Visit (INDEPENDENT_AMBULATORY_CARE_PROVIDER_SITE_OTHER): Payer: Medicaid Other | Admitting: Interventional Cardiology

## 2017-12-14 VITALS — BP 120/80 | HR 95 | Ht 70.0 in | Wt 270.1 lb

## 2017-12-14 DIAGNOSIS — I2510 Atherosclerotic heart disease of native coronary artery without angina pectoris: Secondary | ICD-10-CM

## 2017-12-14 DIAGNOSIS — E785 Hyperlipidemia, unspecified: Secondary | ICD-10-CM

## 2017-12-14 DIAGNOSIS — Z720 Tobacco use: Secondary | ICD-10-CM | POA: Diagnosis not present

## 2017-12-14 DIAGNOSIS — G4733 Obstructive sleep apnea (adult) (pediatric): Secondary | ICD-10-CM

## 2017-12-14 DIAGNOSIS — I252 Old myocardial infarction: Secondary | ICD-10-CM

## 2017-12-14 NOTE — Progress Notes (Addendum)
Cardiology Office Note    Date:  12/14/2017   ID:  Keith Salinas, DOB 1961/07/12, MRN [id number]  PCP:  Lizbeth Bark, FNP  Cardiologist: Lesleigh Noe, MD   Chief Complaint  Patient presents with  . Coronary Artery Disease    History of Present Illness:  Keith Salinas is a 57 y.o. male with history of inferolateral infarction in January 2018, obstructive sleep apnea, mental health disorder, obstructive sleep apnea, hyperlipidemia, and thoracic myelopathy with lower extremity paresis.  The patient had a distal obtuse marginal occlusion in January 2018.  He has not been seen by cardiology since February 2018.  He denies chest discomfort.  States he is taking his medications although this is unclear.  He is followed at the [facility] and [facility].  He has various complaints with the acute concern being puffiness in the left upper quadrant and tenderness.  He was concerned this may be cardiac related.  The discomfort is point tender, not associated with nausea, vomiting, rebound, or fever.  He has had no recurrence of chest discomfort similar to his myocardial infarction in 2018.  He continues to smoke cigarettes.  It is unclear if medication compliance is present.  Past Medical History:  Diagnosis Date  . Acute ST elevation myocardial infarction (STEMI) of inferior wall (HCC) 11/30/2016  . Gait abnormality 03/17/2017  . GERD (gastroesophageal reflux disease)   . Hypercholesteremia   . OSA (obstructive sleep apnea)   . 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 Tobacco use 11/30/2016    Past Surgical History:  Procedure Laterality Date  . CARDIAC CATHETERIZATION N/A 11/30/2016   Procedure: Left Heart Cath and Coronary Angiography;  Surgeon: Lyn Records, MD;  Location: [facility] INVASIVE CV LAB;  Service: Cardiovascular;  Laterality: N/A;  . CARDIAC CATHETERIZATION N/A 11/30/2016   Procedure: Coronary Balloon Angioplasty;  Surgeon: Lyn Records, MD;  Location: [facility] INVASIVE  CV LAB;  Service: Cardiovascular;  Laterality: N/A;  . SPINE SURGERY      Current Medications: Outpatient Medications Prior to Visit  Medication Sig Dispense Refill  . aspirin 81 MG chewable tablet Chew 1 tablet (81 mg total) by mouth daily. 30 tablet 11  . atorvastatin (LIPITOR) 80 MG tablet Take 1 tablet (80 mg total) by mouth daily at 6 PM. 30 tablet 2  . baclofen (LIORESAL) 10 MG tablet Take 20 mg by mouth 2 (two) times daily.    Marland Kitchen buPROPion (WELLBUTRIN SR) 150 MG 12 hr tablet Take 1 tablet (150 mg total) by mouth 2 (two) times daily. 60 tablet 2  . carvedilol (COREG) 3.125 MG tablet Take 1 tablet (3.125 mg total) by mouth 2 (two) times daily with a meal. 60 tablet 2  . clopidogrel (PLAVIX) 75 MG tablet Take 1 tablet (75 mg total) by mouth daily with breakfast. 90 tablet 3  . furosemide (LASIX) 20 MG tablet Take 1 tablet (20 mg total) by mouth daily. 30 tablet 6  . meloxicam (MOBIC) 15 MG tablet Take 15 mg by mouth daily.    . nicotine (NICODERM CQ - DOSED IN MG/24 HOURS) 21 mg/24hr patch Place 1 patch (21 mg total) onto the skin daily. 28 patch 0  . nitroGLYCERIN (NITROSTAT) 0.4 MG SL tablet Place 1 tablet (0.4 mg total) under the tongue every 5 (five) minutes as needed for chest pain. 25 tablet 12  . OxyCODONE HCl, Abuse Deter, (OXAYDO) 5 MG TABA Take 10 mg by mouth daily.     . pantoprazole (PROTONIX)  40 MG tablet Take 1 tablet (40 mg total) by mouth daily. 30 tablet 11  . pregabalin (LYRICA) 100 MG capsule Take 300 mg by mouth 3 (three) times daily.    . QUEtiapine (SEROQUEL) 100 MG tablet Take by mouth.    Marland Kitchen tiZANidine (ZANAFLEX) 2 MG tablet Take 2 mg by mouth 2 (two) times daily.    . potassium chloride (K-DUR) 10 MEQ tablet Take 1 tablet (10 mEq total) by mouth daily. 3 tablet 6   No facility-administered medications prior to visit.      Allergies:   Cats claw (uncaria tomentosa)   Social History   Socioeconomic History  . Marital status: Single    Spouse name: None  .  Number of children: None  . Years of education: None  . Highest education level: None  Social Needs  . Financial resource strain: None  . Food insecurity - worry: None  . Food insecurity - inability: None  . Transportation needs - medical: None  . Transportation needs - non-medical: None  Occupational History  . Occupation: Disabled  Tobacco Use  . Smoking status: Current Every Day Smoker    Types: Cigars  . Smokeless tobacco: Never Used  Substance and Sexual Activity  . Alcohol use: No  . Drug use: No  . Sexual activity: None  Other Topics Concern  . None  Social History Narrative  . None     Family History:  The patient's family history includes CAD in his father and mother.   ROS:   Please see the history of present illness.    Lower extremity weakness.  Tobacco use.  Denies chest pain. All other systems reviewed and are negative.   PHYSICAL EXAM:   VS:  BP 120/80   Pulse 95   Ht 5\' 10"  (1.778 m)   Wt 270 lb 1.9 oz (122.5 kg)   BMI 38.76 kg/m    GEN: Well nourished, well developed, in no acute distress  HEENT: normal  Neck: no JVD, carotid bruits, or masses Cardiac: RRR; no murmurs, rubs, or gallops,no edema  Respiratory:  clear to auscultation bilaterally, normal work of breathing GI: soft, nontender, nondistended, + BS MS: no deformity or atrophy  Skin: warm and dry, no rash Neuro:  Alert and Oriented x 3, Strength and sensation are intact Psych: euthymic mood, full affect  Wt Readings from Last 3 Encounters:  12/14/17 270 lb 1.9 oz (122.5 kg)  10/14/17 263 lb 6.4 oz (119.5 kg)  03/17/17 253 lb (114.8 kg)      Studies/Labs Reviewed:   EKG:  EKG normal sinus rhythm, nonspecific T wave abnormality.  No change when compared to prior.  Recent Labs: No results found for requested labs within last 8760 hours.   Lipid Panel    Component Value Date/Time   CHOL 218 (H) 11/30/2016 0653   TRIG 228 (H) 11/30/2016 0653   HDL 43 11/30/2016 0653   CHOLHDL  5.1 11/30/2016 0653   VLDL 46 (H) 11/30/2016 0653   LDLCALC 129 (H) 11/30/2016 0653    Additional studies/ records that were reviewed today include:  Coronary Diagrams   Diagnostic Diagram       Post-Intervention Diagram         2D Doppler echocardiogram 12/02/2016: Study Conclusions   - Left ventricle: The cavity size was normal. Wall thickness was   increased increased in a pattern of mild to moderate LVH.   Systolic function was normal. The estimated ejection fraction was  in the range of 55% to 60%. Wall motion was normal; there were no   regional wall motion abnormalities. Features are consistent with   a pseudonormal left ventricular filling pattern, with concomitant   abnormal relaxation and increased filling pressure (grade 2   diastolic dysfunction).    ASSESSMENT:    1. Occlusive coronary artery disease requiring drug therapy - occluded OM 3. Unable to revascularize   2. 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 Dyslipidemia, goal LDL below 70   4. Tobacco use      PLAN:  In order of problems listed above:  1. Occlusion of the mid to distal segment of the third obtuse marginal without recurrent chest discomfort to suggest recurrent ischemia.  Angiography demonstrated otherwise widely patent coronaries.  Plan secondary risk factor modification with LDL target less than 70, smoking cessation, and tight blood pressure control.  Plavix can be discontinued.  Continue baby aspirin.  Continue atorvastatin.  Continue beta-blocker therapy.  Diuretic therapy as needed. 2. No evidence of heart failure/volume overload. 3. Lipid panel is obtained today. 4. Encouraged to stop smoking.  Clinical follow-up in 1 year.  Risk factor modification.    Medication Adjustments/Labs and Tests Ordered: Current medicines are reviewed at length with the patient today.  Concerns regarding medicines are outlined above.  Medication changes, Labs and Tests ordered today are  listed in the Patient Instructions below. There are no Patient Instructions on file for this visit.   Signed, Lesleigh Noe, MD  12/14/2017 12:25 PM    [facility] [address] [place], [place], [place]  [address] Phone: [phone]; Fax: [phone]

## 2017-12-14 NOTE — Patient Instructions (Addendum)
Medication Instructions:  Your physician recommends that you continue on your current medications as directed. Please refer to the Current Medication list given to you today.  Labwork: Lipid, CMET and CBC today  Testing/Procedures: None  Follow-Up: Your physician wants you to follow-up in: 1 year with Dr. Katrinka Blazing.  You will receive a reminder letter in the mail two months in advance. If you don't receive a letter, please call our office to schedule the follow-up appointment.   Any Other Special Instructions Will Be Listed Below (If Applicable).     If you need a refill on your cardiac medications before your next appointment, please call your pharmacy.

## 2017-12-15 LAB — LIPID PANEL
CHOL/HDL RATIO: 3.5 ratio (ref 0.0–5.0)
Cholesterol, Total: 187 mg/dL (ref 100–199)
HDL: 53 mg/dL (ref >39)
LDL CALC: 105 mg/dL — AB (ref 0–99)
Triglycerides: 145 mg/dL (ref 0–149)
VLDL CHOLESTEROL CAL: 29 mg/dL (ref 5–40)

## 2017-12-15 LAB — COMPREHENSIVE METABOLIC PANEL
ALT: 31 IU/L (ref 0–44)
AST: 33 IU/L (ref 0–40)
Albumin/Globulin Ratio: 1.1 — ABNORMAL LOW (ref 1.2–2.2)
Albumin: 4.1 g/dL (ref 3.5–5.5)
Alkaline Phosphatase: 39 IU/L (ref 39–117)
BUN/Creatinine Ratio: 13 (ref 9–20)
BUN: 15 mg/dL (ref 6–24)
Bilirubin Total: 0.4 mg/dL (ref 0.0–1.2)
CALCIUM: 9.3 mg/dL (ref 8.7–10.2)
CO2: 17 mmol/L — AB (ref 20–29)
CREATININE: 1.15 mg/dL (ref 0.76–1.27)
Chloride: 105 mmol/L (ref 96–106)
GFR, EST AFRICAN AMERICAN: 82 mL/min/{1.73_m2} (ref >59)
GFR, EST NON AFRICAN AMERICAN: 71 mL/min/{1.73_m2} (ref >59)
Globulin, Total: 3.7 g/dL (ref 1.5–4.5)
Glucose: 99 mg/dL (ref 65–99)
Potassium: 4.3 mmol/L (ref 3.5–5.2)
Sodium: 140 mmol/L (ref 134–144)
TOTAL PROTEIN: 7.8 g/dL (ref 6.0–8.5)

## 2017-12-15 LAB — CBC
HEMATOCRIT: 44.5 % (ref 37.5–51.0)
HEMOGLOBIN: 15.2 g/dL (ref 13.0–17.7)
MCH: 34.5 pg — AB (ref 26.6–33.0)
MCHC: 34.2 g/dL (ref 31.5–35.7)
MCV: 101 fL — AB (ref 79–97)
Platelets: 295 10*3/uL (ref 150–379)
RBC: 4.41 x10E6/uL (ref 4.14–5.80)
RDW: 14 % (ref 12.3–15.4)
WBC: 8 10*3/uL (ref 3.4–10.8)

## 2017-12-20 NOTE — Progress Notes (Signed)
Pt has been made aware of normal result and verbalized understanding.  jw

## 2017-12-20 NOTE — Addendum Note (Signed)
Addended by: Channing Mutters on: 12/20/2017 05:21 PM   Modules accepted: Orders

## 2018-01-21 ENCOUNTER — Ambulatory Visit: Payer: Medicaid Other | Attending: Nurse Practitioner | Admitting: Nurse Practitioner

## 2018-01-21 ENCOUNTER — Encounter: Payer: Self-pay | Admitting: Nurse Practitioner

## 2018-01-21 ENCOUNTER — Ambulatory Visit: Payer: Medicaid Other | Admitting: Licensed Clinical Social Worker

## 2018-01-21 VITALS — BP 142/89 | HR 109 | Temp 98.2°F | Ht 70.0 in | Wt 265.6 lb

## 2018-01-21 DIAGNOSIS — I10 Essential (primary) hypertension: Secondary | ICD-10-CM

## 2018-01-21 DIAGNOSIS — H5710 Ocular pain, unspecified eye: Secondary | ICD-10-CM | POA: Insufficient documentation

## 2018-01-21 DIAGNOSIS — F329 Major depressive disorder, single episode, unspecified: Secondary | ICD-10-CM

## 2018-01-21 DIAGNOSIS — E7800 Pure hypercholesterolemia, unspecified: Secondary | ICD-10-CM | POA: Insufficient documentation

## 2018-01-21 DIAGNOSIS — I824Z3 Acute embolism and thrombosis of unspecified deep veins of distal lower extremity, bilateral: Secondary | ICD-10-CM | POA: Diagnosis not present

## 2018-01-21 DIAGNOSIS — R531 Weakness: Secondary | ICD-10-CM | POA: Insufficient documentation

## 2018-01-21 DIAGNOSIS — Z79899 Other long term (current) drug therapy: Secondary | ICD-10-CM | POA: Insufficient documentation

## 2018-01-21 DIAGNOSIS — Z0100 Encounter for examination of eyes and vision without abnormal findings: Secondary | ICD-10-CM

## 2018-01-21 DIAGNOSIS — F17200 Nicotine dependence, unspecified, uncomplicated: Secondary | ICD-10-CM | POA: Insufficient documentation

## 2018-01-21 DIAGNOSIS — Z7982 Long term (current) use of aspirin: Secondary | ICD-10-CM | POA: Diagnosis not present

## 2018-01-21 DIAGNOSIS — Z8249 Family history of ischemic heart disease and other diseases of the circulatory system: Secondary | ICD-10-CM | POA: Diagnosis not present

## 2018-01-21 DIAGNOSIS — G4733 Obstructive sleep apnea (adult) (pediatric): Secondary | ICD-10-CM | POA: Diagnosis not present

## 2018-01-21 DIAGNOSIS — K219 Gastro-esophageal reflux disease without esophagitis: Secondary | ICD-10-CM | POA: Insufficient documentation

## 2018-01-21 DIAGNOSIS — I252 Old myocardial infarction: Secondary | ICD-10-CM | POA: Insufficient documentation

## 2018-01-21 DIAGNOSIS — Z1211 Encounter for screening for malignant neoplasm of colon: Secondary | ICD-10-CM

## 2018-01-21 DIAGNOSIS — R29898 Other symptoms and signs involving the musculoskeletal system: Secondary | ICD-10-CM | POA: Diagnosis not present

## 2018-01-21 DIAGNOSIS — F419 Anxiety disorder, unspecified: Secondary | ICD-10-CM

## 2018-01-21 DIAGNOSIS — F32A Depression, unspecified: Secondary | ICD-10-CM

## 2018-01-21 MED ORDER — NICOTINE 21 MG/24HR TD PT24: 21.0000 mg | MEDICATED_PATCH | Freq: Every day | TRANSDERMAL | 0 refills | Status: DC

## 2018-01-21 NOTE — Patient Instructions (Signed)
Transverse Myelitis °Transverse myelitis is a condition that causes inflammation of the spinal cord. The inflammation affects the fatty lining that covers spinal cord nerves (myelin). It can cause scarring of nerves, which can interfere with nerve signals passing to and from the spinal cord. °Signs and symptoms of this condition happen at the affected level of the spinal cord and below. The condition most often causes weakness of the arms or legs, pain, changes in feeling (sensation), and bowel and bladder problems. °What are the causes? °The exact cause of this condition is not known. It sometimes develops after a viral infection, such as herpes, chicken pox, cytomegalovirus, HIV, or Epstein-Barr virus. It can also occur after a bacterial infection or along with diseases that make the body’s immune system mistakenly attack healthy tissues (autoimmune diseases). °What increases the risk? °This condition is more likely to develop in: °· People who have an autoimmune disease, especially multiple sclerosis and neuromyelitis optica. °· People 10-19 years old. °· People 30-39 years old. ° °What are the signs or symptoms? °Symptoms of this condition may start suddenly within hours or develop gradually over weeks. Symptoms include: °· Pain, especially in the neck or back, with shooting pains into the legs. °· Headache. °· Weakness of the arms or the legs. °· Abnormal sensations, such as burning, prickling, numbness, or tingling in the arms or legs. °· Increased sensitivity to touch or changes in temperature. °· Bowel and bladder problems, including increased need to go, loss of control, and difficulty going. °· Difficulty walking, including foot dragging and stumbling. °· Fatigue. °· Fever. °· Loss of appetite. °· Paralysis. °· Difficulty breathing. ° °How is this diagnosed? °This condition may be diagnosed with a neurological exam. During this exam, your health care provider will ask about your symptoms and do a complete  physical exam to check your spinal cord function. You may need to see a nervous system specialist (neurologist) to have tests, which may include: °· An MRI to check for inflammation or scarring. °· Blood tests to check for infections that can trigger this condition or for neuromyelitis optica. °· A lumbar puncture to check your spinal fluid for signs of infection or inflammation. For this procedure, a small amount of the fluid that surrounds the brain and spinal cord is removed and examined. ° °How is this treated? °There is no cure for this condition. You may have treatment to reduce inflammation and control symptoms. Treatment may involve: °· Pain medicine. °· Corticosteroid medicines to reduce inflammation. These are usually given through an IV needle at first. Later, they may be taken by mouth. °· Breathing support with a device called a respirator. °· Physical therapy to: °? Reduce the risk of bedsores. °? Improve muscle strength, flexibility, coordination, and range of motion in affected muscles. °? Reduce muscle spasms and muscle wasting in paralyzed arms or legs. °? Improve control over your bladder and bowel. °· Occupational therapy. This therapy helps you learn how to care for yourself and do everyday tasks such as bathing and dressing. It cannot reverse problems caused by this condition, but it can help you become as independent as possible. ° °Follow these instructions at home: °· Take over-the-counter and prescription medicines only as told by your health care provider. °· Rest in bed at home as told by your health care provider until you start to recover strength and movement. Ask your health care provider what activities are safe for you. °· Perform any exercises as told by your health care   provider. °· Keep all follow-up visits as told by your health care provider. This is important. °Contact a health care provider if: °· Your symptoms are not improving or are getting worse. °· You have symptoms that  come back after going away. °· You are having a hard time managing at home. °Get help right away if: °· You cannot care for yourself at home. °· You have trouble breathing. °This information is not intended to replace advice given to you by your health care provider. Make sure you discuss any questions you have with your health care provider. °Document Released: 10/09/2002 Document Revised: 06/21/2016 Document Reviewed: 03/27/2015 °Elsevier Interactive Patient Education © 2018 Elsevier Inc. ° °

## 2018-01-21 NOTE — Progress Notes (Signed)
Assessment & Plan:  Keith Salinas was seen today for establish care and eye problem.  Diagnoses and all orders for this visit:  Bilateral leg weakness and Pain -     Ambulatory referral to Physical Therapy Work on losing weight to help reduce back pain. May alternate with heat and ice application for pain relief.  Other alternatives include massage, acupuncture and water aerobics.  You must stay active and avoid a sedentary lifestyle.    Routine eye exam -     Ambulatory referral to Ophthalmology  Tobacco dependence -     nicotine (NICODERM CQ - DOSED IN MG/24 HOURS) 21 mg/24hr patch; Place 1 patch (21 mg total) onto the skin daily. -     nicotine (NICODERM CQ) 14 mg/24hr patch; Place 1 patch (14 mg total) onto the skin daily. -     nicotine (NICODERM CQ) 7 mg/24hr patch; Place 1 patch (7 mg total) onto the skin daily. 1. Saylor continues to smoke daily. 2. Conrado was counseled on the dangers of tobacco use, and was advised to quit. We reviewed specific strategies to maximize success, including removing cigarettes and smoking materials from environment, stress management and support of family/friends as well as pharmacological alternatives. 3. A total of 4 minutes was spent on counseling for smoking cessation and Oseias is ready to quit and has chosen Nicotine Patches to start today.  4. Elmer was offered Wellbutrin, Chantix, and Nicotine gum or lozenges.   Dale was also informed of our Smoking cessation classes which are also available through [facility] by calling [phone] or visit our website at [email or web address].  5. Will follow up at next scheduled office visit.   Deep vein thrombosis (DVT) of distal vein of both lower extremities, unspecified chronicity (HCC) RESOLVED  Colon cancer screening -     Ambulatory referral to Gastroenterology   Essential Hypertension Continue all antihypertensives as prescribed.  Remember to bring in your blood  pressure log with you for your follow up appointment.  DASH/Mediterranean Diets are healthier choices for HTN.   Patient has been counseled on age-appropriate routine health concerns for screening and prevention. These are reviewed and up-to-date. Referrals have been placed accordingly. Immunizations are up-to-date or declined.    Subjective:   Chief Complaint  Patient presents with  . Establish Care    Pt is here for hypertension and establish care.  . Eye Problem    Pt stated he have a bump on both of his eye that hurts. Pt. stated it comes and go frequrently, with crust.    HPI Keith Salinas 57 y.o. male presents to office today to establish care. He is very argumentative and defensive with me today. When I inquire into his current mobility status as he is sitting on his rolling seated walker, he questions "HAVEN'T YOU READ MY CHART!" I explained to him that although I have read his chart I still need to inquire as to what changes may have occurred since his previous office visits. He continued to be defensive throughout most of the history taking.   Essential Hypertension He does not check his blood pressure at home. His blood pressure is not well controlled today despite his endorsing of taking his coreg as prescribed.  He continues to smoke and is not diet or exercise compliant. Denies chest pain, shortness of breath, palpitations, lightheadedness, dizziness or headaches.   BP Readings from Last 3 Encounters:  01/21/18 (!) 142/89  12/14/17 120/80  10/14/17 113/74  BLE Weakness He has a history of thoracic myelopathy. Being evaluated at [facility]. PER notes reviewed dated 01-04-2018 he has been instructed to follow up with a stroke specialist, ortho/spine clinic and MRI of spine and brain is pending. Currently taking baclofen, lyrica, zanaflex and oxycodone for pain relief. Neurology appt 02-18-2018   Eye Pain  He instructed the CMA today that he had a bump on his left  eye. After a thorough physical exam with no findings he states the bump "come and goes".   History of NSTEMI Jan 2018 Seeing Dr. Katrinka Blazing. He continues to smoke. Denies chest pain or shortness of breath at this time. He does endorse BLE edema. Taking furosemide as prescribed.    Review of Systems  Constitutional: Negative for fever, malaise/fatigue and weight loss.  HENT: Negative.  Negative for nosebleeds.   Eyes: Positive for pain. Negative for blurred vision, double vision and photophobia.  Respiratory: Negative.  Negative for cough and shortness of breath.   Cardiovascular: Positive for leg swelling. Negative for chest pain and palpitations.  Gastrointestinal: Positive for heartburn. Negative for nausea and vomiting.  Musculoskeletal: Positive for back pain and myalgias.  Neurological: Positive for focal weakness. Negative for dizziness, seizures and headaches.  Psychiatric/Behavioral: Positive for depression. Negative for suicidal ideas.    Past Medical History:  Diagnosis Date  . Acute ST elevation myocardial infarction (STEMI) of inferior wall (HCC) 11/30/2016  . Gait abnormality 03/17/2017  . GERD (gastroesophageal reflux disease)   . Hypercholesteremia   . OSA (obstructive sleep apnea)   . 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 Tobacco use 11/30/2016    Past Surgical History:  Procedure Laterality Date  . CARDIAC CATHETERIZATION N/A 11/30/2016   Procedure: Left Heart Cath and Coronary Angiography;  Surgeon: Lyn Records, MD;  Location: [facility] INVASIVE CV LAB;  Service: Cardiovascular;  Laterality: N/A;  . CARDIAC CATHETERIZATION N/A 11/30/2016   Procedure: Coronary Balloon Angioplasty;  Surgeon: Lyn Records, MD;  Location: [facility] INVASIVE CV LAB;  Service: Cardiovascular;  Laterality: N/A;  . SPINE SURGERY      Family History  Problem Relation Age of Onset  . CAD Mother   . CAD Father     Social History Reviewed with no changes to be made today.   Outpatient Medications Prior to  Visit  Medication Sig Dispense Refill  . aspirin 81 MG chewable tablet Chew 1 tablet (81 mg total) by mouth daily. 30 tablet 11  . atorvastatin (LIPITOR) 80 MG tablet Take 1 tablet (80 mg total) by mouth daily at 6 PM. 30 tablet 2  . baclofen (LIORESAL) 10 MG tablet Take 20 mg by mouth 2 (two) times daily.    Marland Kitchen buPROPion (WELLBUTRIN SR) 150 MG 12 hr tablet Take 1 tablet (150 mg total) by mouth 2 (two) times daily. 60 tablet 2  . carvedilol (COREG) 3.125 MG tablet Take 1 tablet (3.125 mg total) by mouth 2 (two) times daily with a meal. 60 tablet 2  . furosemide (LASIX) 20 MG tablet Take 1 tablet (20 mg total) by mouth daily. 30 tablet 6  . meloxicam (MOBIC) 15 MG tablet Take 15 mg by mouth daily.    . nitroGLYCERIN (NITROSTAT) 0.4 MG SL tablet Place 1 tablet (0.4 mg total) under the tongue every 5 (five) minutes as needed for chest pain. 25 tablet 12  . OxyCODONE HCl, Abuse Deter, (OXAYDO) 5 MG TABA Take 10 mg by mouth daily.     . pantoprazole (PROTONIX) 40 MG tablet  Take 1 tablet (40 mg total) by mouth daily. 30 tablet 11  . pregabalin (LYRICA) 100 MG capsule Take 300 mg by mouth 3 (three) times daily.    . QUEtiapine (SEROQUEL) 100 MG tablet Take by mouth.    Marland Kitchen tiZANidine (ZANAFLEX) 2 MG tablet Take 2 mg by mouth 2 (two) times daily.    . potassium chloride (K-DUR) 10 MEQ tablet Take 1 tablet (10 mEq total) by mouth daily. 3 tablet 6  . nicotine (NICODERM CQ - DOSED IN MG/24 HOURS) 21 mg/24hr patch Place 1 patch (21 mg total) onto the skin daily. (Patient not taking: Reported on 01/21/2018) 28 patch 0   No facility-administered medications prior to visit.     Allergies  Allergen Reactions  . Cats Claw (Uncaria Tomentosa)        Objective:    BP (!) 142/89 (BP Location: Right Arm, Patient Position: Sitting, Cuff Size: Large)   Pulse (!) 109   Temp 98.2 F (36.8 C) (Oral)   Ht 5\' 10"  (1.778 m)   Wt 265 lb 9.6 oz (120.5 kg)   SpO2 96%   BMI 38.11 kg/m  Wt Readings from Last 3  Encounters:  01/21/18 265 lb 9.6 oz (120.5 kg)  12/14/17 270 lb 1.9 oz (122.5 kg)  10/14/17 263 lb 6.4 oz (119.5 kg)    Physical Exam  Constitutional: He is oriented to person, place, and time. He appears well-developed and well-nourished. He is cooperative.  HENT:  Head: Normocephalic and atraumatic.  Eyes: Pupils are equal, round, and reactive to light. Conjunctivae, EOM and lids are normal. Right eye exhibits no chemosis, no discharge, no exudate and no hordeolum. No foreign body present in the right eye. Left eye exhibits no chemosis, no discharge, no exudate and no hordeolum. No foreign body present in the left eye.  Neck: Normal range of motion.  Cardiovascular: Regular rhythm and normal heart sounds. Tachycardia present. Exam reveals no gallop and no friction rub.  No murmur heard. Pulmonary/Chest: Effort normal and breath sounds normal. No tachypnea. No respiratory distress. He has no decreased breath sounds. He has no wheezes. He has no rhonchi. He has no rales. He exhibits no tenderness.  Abdominal: Soft. Bowel sounds are normal.  Musculoskeletal: Normal range of motion. He exhibits no edema.       Lumbar back: He exhibits pain.  Neurological: He is alert and oriented to person, place, and time. Coordination normal.  Skin: Skin is warm and dry.  Psychiatric: He has a normal mood and affect. His behavior is normal. Judgment and thought content normal.  Nursing note and vitals reviewed.     Patient has been counseled extensively about nutrition and exercise as well as the importance of adherence with medications and regular follow-up. The patient was given clear instructions to go to ER or return to medical center if symptoms don't improve, worsen or new problems develop. The patient verbalized understanding.   Follow-up: Return in about 1 month (around 02/18/2018) for BP recheck/thoracic myelopathy.   Claiborne Rigg, FNP-BC [facility]  [place], [place] [phone]   01/23/2018, 2:14 AM

## 2018-01-21 NOTE — BH Specialist Note (Signed)
Integrated Behavioral Health Initial Visit  MRN: [id number] Name: Keith Salinas  Number of Integrated Behavioral Health Clinician visits:: 1/6 Session Start time: 11:15 AM  Session End time: 12:00 PM Total time: 45 minutes  Type of Service: Integrated Behavioral Health- Individual/Family Interpretor:No. Interpretor Name and Language: N/A   Warm Hand Off Completed.       SUBJECTIVE: Keith Salinas is a 57 y.o. male accompanied by self Patient was referred by self for depression and anxiety. Patient reports the following symptoms/concerns: decreased pleasure in doing things that once were pleasurable, difficulty sleeping, low energy, decreased concentration, and irritability Duration of problem: Ongoing; Severity of problem: mild  OBJECTIVE: Mood: Appropriate and Affect: Appropriate Risk of harm to self or others: No plan to harm self or others  LIFE CONTEXT: Family and Social: Pt reports having family and friends as forms of support School/Work: Pt has medicaid and receives income Self-Care: Pt participates in medication management Life Changes: Pt has ongoing medical conditions. He reports conflict with current agency providing CAPS services  GOALS ADDRESSED: Patient will: 1. Reduce symptoms of: stress 2. Increase knowledge and/or ability of: coping skills  3. Demonstrate ability to: Increase adequate support systems for patient/family  INTERVENTIONS: Interventions utilized: Solution-Focused Strategies, Supportive Counseling and Psychoeducation and/or Health Education  Standardized Assessments completed: GAD-7 and PHQ 2&9  ASSESSMENT: Patient currently experiencing depression and anxiety triggered by conflict with current agency providing CAPS services. Pt reports decreased pleasure in doing things that once were pleasurable, difficulty sleeping, low energy, decreased concentration, and irritability. He receives support in the community. Denies SI/HI/AVH.   Patient may  benefit from psychoeducation and psychotherapy. LCSWA educated pt on correlation between one's mental and physical health, in addition, to how stress can negatively impact both. Therapeutic interventions were discussed to promote positive feelings and decrease symptoms. Pt is participating in medication management through PCP and is open to psychotherapy. Local community resources were provided. No additional concerns noted.   PLAN: 1. Follow up with behavioral health clinician on : Pt was encouraged to contact LCSWA if symptoms worsen or fail to improve to schedule behavioral appointments at [facility]. 2. Behavioral recommendations: LCSWA recommends that pt apply healthy coping skills discussed, comply with medication management, and utilize provided resources. Pt is encouraged to schedule follow up appointment with LCSWA 3. Referral(s): Integrated Art gallery manager (In Clinic) and Community Mental Health Services (LME/Outside Clinic) 4. "From scale of 1-10, how likely are you to follow plan?":   Bridgett Larsson, LCSW 01/21/18 4:41 PM

## 2018-01-23 ENCOUNTER — Encounter: Payer: Self-pay | Admitting: Nurse Practitioner

## 2018-01-23 DIAGNOSIS — I824Z3 Acute embolism and thrombosis of unspecified deep veins of distal lower extremity, bilateral: Secondary | ICD-10-CM | POA: Insufficient documentation

## 2018-01-23 MED ORDER — NICOTINE 14 MG/24HR TD PT24: 14.0000 mg | MEDICATED_PATCH | Freq: Every day | TRANSDERMAL | 0 refills | Status: DC

## 2018-01-23 MED ORDER — NICOTINE 7 MG/24HR TD PT24: 7.0000 mg | MEDICATED_PATCH | Freq: Every day | TRANSDERMAL | 0 refills | Status: DC

## 2018-02-11 ENCOUNTER — Telehealth: Payer: Self-pay | Admitting: Licensed Clinical Social Worker

## 2018-02-11 NOTE — Telephone Encounter (Signed)
LCSWA received an incoming call from Ms. Koleen Distance, CAPS Case Manager. Ms. Yetta Barre states that pt provided LCSWA's contact information.   Ms. Yetta Barre shared that pt receives CAPS services; however, there has been an increase in behavioral concerns (I.e. Name calling, inappropriate sexual conduct, and irritability) Ms. Yetta Barre has concerns that pt may need additional support to assist with management of behavioral health needs.   LCSWA thanked Ms. Yetta Barre for the information.   Plan of Action LCSWA will contact pt to schedule an appointment to address concerns. Supportive resources will be provided.

## 2018-03-01 ENCOUNTER — Ambulatory Visit: Payer: Medicaid Other | Admitting: Nurse Practitioner

## 2018-03-03 ENCOUNTER — Ambulatory Visit: Payer: Medicaid Other | Attending: Nurse Practitioner | Admitting: Physician Assistant

## 2018-03-03 VITALS — BP 123/82 | HR 86 | Temp 98.9°F | Resp 16 | Ht 70.0 in | Wt 261.4 lb

## 2018-03-03 DIAGNOSIS — I10 Essential (primary) hypertension: Secondary | ICD-10-CM | POA: Insufficient documentation

## 2018-03-03 DIAGNOSIS — K219 Gastro-esophageal reflux disease without esophagitis: Secondary | ICD-10-CM | POA: Insufficient documentation

## 2018-03-03 DIAGNOSIS — Z7982 Long term (current) use of aspirin: Secondary | ICD-10-CM | POA: Insufficient documentation

## 2018-03-03 DIAGNOSIS — Z791 Long term (current) use of non-steroidal anti-inflammatories (NSAID): Secondary | ICD-10-CM | POA: Diagnosis not present

## 2018-03-03 DIAGNOSIS — Z79899 Other long term (current) drug therapy: Secondary | ICD-10-CM | POA: Diagnosis not present

## 2018-03-03 DIAGNOSIS — I252 Old myocardial infarction: Secondary | ICD-10-CM | POA: Diagnosis not present

## 2018-03-03 DIAGNOSIS — M25561 Pain in right knee: Secondary | ICD-10-CM

## 2018-03-03 DIAGNOSIS — F17200 Nicotine dependence, unspecified, uncomplicated: Secondary | ICD-10-CM

## 2018-03-03 DIAGNOSIS — I219 Acute myocardial infarction, unspecified: Secondary | ICD-10-CM | POA: Insufficient documentation

## 2018-03-03 DIAGNOSIS — G4733 Obstructive sleep apnea (adult) (pediatric): Secondary | ICD-10-CM | POA: Diagnosis not present

## 2018-03-03 DIAGNOSIS — G8929 Other chronic pain: Secondary | ICD-10-CM

## 2018-03-03 DIAGNOSIS — E7800 Pure hypercholesterolemia, unspecified: Secondary | ICD-10-CM | POA: Insufficient documentation

## 2018-03-03 MED ORDER — NICOTINE 14 MG/24HR TD PT24: 14.0000 mg | MEDICATED_PATCH | Freq: Every day | TRANSDERMAL | 0 refills | Status: DC

## 2018-03-03 MED ORDER — NICOTINE 7 MG/24HR TD PT24: 7.0000 mg | MEDICATED_PATCH | Freq: Every day | TRANSDERMAL | 0 refills | Status: DC

## 2018-03-03 MED ORDER — PANTOPRAZOLE SODIUM 40 MG PO TBEC: 40.0000 mg | DELAYED_RELEASE_TABLET | Freq: Every day | ORAL | 11 refills | Status: DC

## 2018-03-03 NOTE — Progress Notes (Signed)
Patient need a referral for his brain ct scan and spinal surgery.   Patient need medication refill, nicotine patch, and pantoprazole.

## 2018-03-03 NOTE — Progress Notes (Signed)
Patient ID: Keith Salinas, male   DOB: [birth year], 57 y.o.   MRN: [id number]   Harsh Scipio, is a 57 y.o. male  RVU:[id number]  SHU:[id number]  DOB - Mar 20, 1961  Subjective:  Chief Complaint and HPI: Keith Salinas is a 57 y.o. male here today to get RF on pantoprazole and nicotine patches.  Also requesting an injection in his R knee which we don't do in our office.  Pain in R knee is chronic and ongoing.    ROS:   Constitutional:  No f/c, No night sweats, No unexplained weight loss. EENT:  No vision changes, No blurry vision, No hearing changes. No mouth, throat, or ear problems.  Respiratory: No cough, No SOB Cardiac: No CP, no palpitations GI:  No abd pain, No N/V/D. GU: No Urinary s/sx Musculoskeletal:  R knee pain Neuro: No headache, no dizziness, no motor weakness.  Skin: No rash Endocrine:  No polydipsia. No polyuria.  Psych: Denies SI/HI  No problems updated.  ALLERGIES: Allergies  Allergen Reactions  . Cats Claw (Uncaria Tomentosa)     PAST MEDICAL HISTORY: Past Medical History:  Diagnosis Date  . Acute ST elevation myocardial infarction (STEMI) of inferior wall (HCC) 11/30/2016  . Gait abnormality 03/17/2017  . GERD (gastroesophageal reflux disease)   . Hypercholesteremia   . OSA (obstructive sleep apnea)   . 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 Tobacco use 11/30/2016    MEDICATIONS AT HOME: Prior to Admission medications   Medication Sig Start Date End Date Taking? Authorizing Provider  aspirin 81 MG chewable tablet Chew 1 tablet (81 mg total) by mouth daily. 10/15/17  Yes Hairston, Oren Beckmann, FNP  atorvastatin (LIPITOR) 80 MG tablet Take 1 tablet (80 mg total) by mouth daily at 6 PM. 10/15/17  Yes Hairston, Mandesia R, FNP  baclofen (LIORESAL) 10 MG tablet Take 20 mg by mouth 2 (two) times daily.   Yes [provider]  buPROPion (WELLBUTRIN SR) 150 MG 12 hr tablet Take 1 tablet (150 mg total) by mouth 2 (two) times daily. 10/15/17  Yes Lizbeth Bark, FNP  carvedilol (COREG) 3.125 MG tablet Take 1 tablet (3.125 mg total) by mouth 2 (two) times daily with a meal. 10/15/17  Yes Hairston, Mandesia R, FNP  furosemide (LASIX) 20 MG tablet Take 1 tablet (20 mg total) by mouth daily. 10/15/17  Yes Hairston, Oren Beckmann, FNP  meloxicam (MOBIC) 15 MG tablet Take 15 mg by mouth daily.   Yes [provider]  nicotine (NICODERM CQ) 14 mg/24hr patch Place 1 patch (14 mg total) onto the skin daily. 03/03/18  Yes Jahleel Stroschein, Marzella Schlein, PA-C  nicotine (NICODERM CQ) 7 mg/24hr patch Place 1 patch (7 mg total) onto the skin daily. 03/03/18  Yes Anders Simmonds, PA-C  OxyCODONE HCl, Abuse Deter, (OXAYDO) 5 MG TABA Take 10 mg by mouth daily.    Yes [provider]  pregabalin (LYRICA) 100 MG capsule Take 300 mg by mouth 3 (three) times daily.   Yes [provider]  QUEtiapine (SEROQUEL) 100 MG tablet Take by mouth.   Yes [provider]  tiZANidine (ZANAFLEX) 2 MG tablet Take 2 mg by mouth 2 (two) times daily.   Yes [provider]  nitroGLYCERIN (NITROSTAT) 0.4 MG SL tablet Place 1 tablet (0.4 mg total) under the tongue every 5 (five) minutes as needed for chest pain. Patient not taking: Reported on 03/03/2018 12/02/16   Manson Passey, PA  pantoprazole (PROTONIX) 40 MG tablet Take 1 tablet (40 mg  total) by mouth daily. 03/03/18   Anders Simmonds, PA-C  potassium chloride (K-DUR) 10 MEQ tablet Take 1 tablet (10 mEq total) by mouth daily. 12/21/16 03/21/17  Leone Brand, NP     Objective:  EXAM:   Vitals:   03/03/18 1040  BP: 123/82  Pulse: 86  Resp: 16  Temp: 98.9 F (37.2 C)  TempSrc: Oral  SpO2: 95%  Weight: 261 lb 6.4 oz (118.6 kg)  Height: 5\' 10"  (1.778 m)    General appearance : A&OX3. NAD. Non-toxic-appearing; uses rolling walker. HEENT: Atraumatic and Normocephalic.  PERRLA. EOM intact.   Neck: supple, no JVD. No cervical lymphadenopathy. No thyromegaly Chest/Lungs:   Breathing-non-labored, Good air entry bilaterally, breath sounds normal without rales, rhonchi, or wheezing  CVS: S1 S2 regular, no murmurs, gallops, rubs  Extremities: Bilateral Lower Ext shows no edema, both legs are warm to touch with = pulse throughout Neurology:  CN II-XII grossly intact, Non focal.   Psych:  TP linear. J/I WNL. Normal speech. Appropriate eye contact and affect.  Skin:  No Rash  Data Review Lab Results  Component Value Date   HGBA1C 5.1 11/30/2016     Assessment & Plan   1. Chronic pain of right knee - Ambulatory referral to Orthopedic Surgery  2. Tobacco dependence - nicotine (NICODERM CQ) 7 mg/24hr patch; Place 1 patch (7 mg total) onto the skin daily.  Dispense: 28 patch; Refill: 0 - nicotine (NICODERM CQ) 14 mg/24hr patch; Place 1 patch (14 mg total) onto the skin daily.  Dispense: 28 patch; Refill: 0  Pantoprazole was rf  Patient have been counseled extensively about nutrition and exercise  Return in about 2 months (around 05/03/2018) for Bertram Denver; htn and ongoing medical issues.  The patient was given clear instructions to go to ER or return to medical center if symptoms don't improve, worsen or new problems develop. The patient verbalized understanding. The patient was told to call to get lab results if they haven't heard anything in the next week.     Georgian Co, PA-C [facility] [place], [place] [phone]   03/03/2018, 11:03 AM

## 2018-03-08 ENCOUNTER — Ambulatory Visit (INDEPENDENT_AMBULATORY_CARE_PROVIDER_SITE_OTHER): Payer: Self-pay | Admitting: Orthopaedic Surgery

## 2018-03-11 ENCOUNTER — Ambulatory Visit: Payer: Medicaid Other | Admitting: Physical Therapy

## 2018-03-17 ENCOUNTER — Ambulatory Visit (INDEPENDENT_AMBULATORY_CARE_PROVIDER_SITE_OTHER): Payer: Self-pay | Admitting: Orthopaedic Surgery

## 2018-03-17 ENCOUNTER — Encounter (INDEPENDENT_AMBULATORY_CARE_PROVIDER_SITE_OTHER): Payer: Self-pay

## 2018-03-18 ENCOUNTER — Encounter: Payer: Self-pay | Admitting: Physical Therapy

## 2018-03-18 ENCOUNTER — Other Ambulatory Visit: Payer: Self-pay

## 2018-03-18 ENCOUNTER — Ambulatory Visit: Payer: Medicaid Other | Attending: Nurse Practitioner | Admitting: Physical Therapy

## 2018-03-18 DIAGNOSIS — R293 Abnormal posture: Secondary | ICD-10-CM | POA: Diagnosis not present

## 2018-03-18 DIAGNOSIS — R2689 Other abnormalities of gait and mobility: Secondary | ICD-10-CM | POA: Diagnosis present

## 2018-03-18 DIAGNOSIS — R29818 Other symptoms and signs involving the nervous system: Secondary | ICD-10-CM | POA: Diagnosis present

## 2018-03-18 DIAGNOSIS — R2681 Unsteadiness on feet: Secondary | ICD-10-CM | POA: Diagnosis present

## 2018-03-18 DIAGNOSIS — G8222 Paraplegia, incomplete: Secondary | ICD-10-CM | POA: Insufficient documentation

## 2018-03-19 NOTE — Therapy (Signed)
[facility] [address] [place], [place], [address] Phone: [phone]   Fax:  [phone]  Physical Therapy Evaluation  Patient Details  Name: Keith Salinas MRN: [id number] Date of Birth: 1960/11/05 Referring Provider: Bertram Denver NP   Encounter Date: 03/18/2018  PT End of Session - 03/18/18 1627    Visit Number  1    Number of Visits  4 eval plus 3 visits    Date for PT Re-Evaluation  -- pending MCD approval     Authorization Type  Medicaid  Access;     Authorization Time Period  await timeframe TBA by insurance    Authorization - Number of Visits  3 have requested 3 visits    PT Start Time  1102    PT Stop Time  1147    PT Time Calculation (min)  45 min    Activity Tolerance  Patient tolerated treatment well    Behavior During Therapy  WFL for tasks assessed/performed       Past Medical History:  Diagnosis Date  . Acute ST elevation myocardial infarction (STEMI) of inferior wall (HCC) 11/30/2016  . Gait abnormality 03/17/2017  . GERD (gastroesophageal reflux disease)   . Hypercholesteremia   . OSA (obstructive sleep apnea)   . 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 Tobacco use 11/30/2016    Past Surgical History:  Procedure Laterality Date  . CARDIAC CATHETERIZATION N/A 11/30/2016   Procedure: Left Heart Cath and Coronary Angiography;  Surgeon: Lyn Records, MD;  Location: [facility] INVASIVE CV LAB;  Service: Cardiovascular;  Laterality: N/A;  . CARDIAC CATHETERIZATION N/A 11/30/2016   Procedure: Coronary Balloon Angioplasty;  Surgeon: Lyn Records, MD;  Location: [facility] INVASIVE CV LAB;  Service: Cardiovascular;  Laterality: N/A;  . SPINE SURGERY      There were no vitals filed for this visit.   Subjective Assessment - 03/18/18 1121    Subjective  My hamstrings get really tight when I walk. I need some new exercises that will make my legs stronger and/or help them relax when they get tight. Left arm is also  weak, it shakes and draws up. Pt also wants to get a prescription for the "Upwalker" he saw on TV.     Pertinent History  thoracic myelopathy, spine surgery(?2013 per pt), HTN, MI, tobacco use, OSA, bil LE DVTs, chronic pain    Limitations  Standing;Walking;Lifting    Patient Stated Goals  get legs stronger, know how to help them relax when they tighten up, get an UPwalker    Currently in Pain?  No/denies         [facility] PT Assessment - 03/18/18 1125      Assessment   Medical Diagnosis  bil leg weakness    Referring Provider  Bertram Denver NP    Onset Date/Surgical Date  -- 2013    Prior Therapy  probably 5 rounds of therapy since surgery      Precautions   Precautions  Other (comment) avoids twisting due to causes incr spasticity in his legs    Precaution Comments  reports MD has said no lifting more than 10# (pt reports he does not adhere to this precaution)      Balance Screen   Has the patient fallen in the past 6 months  Yes    How many times?  3 legs get weak    Has the patient had a decrease in activity level because of a fear of falling?   No  Is the patient reluctant to leave their home because of a fear of falling?   No      Home Environment   Living Environment  Private residence    Living Arrangements  Alone    Available Help at Discharge  Family;Available PRN/intermittently    Home Access  Stairs to enter    Entrance Stairs-Number of Steps  2    Entrance Stairs-Rails  Left    Home Layout  One level    Home Equipment  Walker - 4 wheels;Cane - single point;Cane - quad;Walker - 2 wheels    Additional Comments  wants Upwalker to help him walk upright      Prior Function   Level of Independence  Independent with household mobility with device;Independent with community mobility with device;Needs assistance with ADLs aid for bathing, works on his feet/skin      Observation/Other Assessments   Observations  walks lobby to exam room with his rollator, forward bent with  it too far ahead of him; smiling, upbeat    Focus on Therapeutic Outcomes (FOTO)   NA      Sensation   Additional Comments  neuropathy bil LEs from mid-torso to toes      Posture/Postural Control   Posture/Postural Control  Postural limitations    Postural Limitations  Rounded Shoulders;Forward head;Decreased lumbar lordosis;Increased thoracic kyphosis;Posterior pelvic tilt      Tone   Assessment Location  Right Lower Extremity;Left Lower Extremity      ROM / Strength   AROM / PROM / Strength  AROM;Strength      AROM   Overall AROM   Within functional limits for tasks performed bil LEs; pt demonstrates heel cord stretch he does daily      Strength   Overall Strength  Deficits    Overall Strength Comments  RLE: knee extension 3+, knee flexion 2+, ankle DF 3+;  LLE-knee extension 3- (give away, very jerky movement with ?effort), knee flexon 2+/5, ankle DF 2/5      Transfers   Transfers  Sit to Stand;Stand to Sit    Sit to Stand  6: Modified independent (Device/Increase time);With upper extremity assist      Ambulation/Gait   Ambulation/Gait  Yes    Ambulation/Gait Assistance  6: Modified independent (Device/Increase time);4: Min guard    Ambulation Distance (Feet)  60 Feet 60, 120    Assistive device  4-wheeled walker;Rollator rollator vs Upwalker    Gait Pattern  Right foot flat;Left foot flat;Decreased trunk rotation;Trunk flexed    Ambulation Surface  Indoor      RLE Tone   RLE Tone  Mild      LLE Tone   LLE Tone  Moderate knee flexion, ankle supination                Objective measurements completed on examination: See above findings.              PT Education - 03/18/18 1625    Education provided  Yes    Education Details  results of PT evaluation; role of PT and POC; for insurance to cover Upwalker, will need an MD prescription; PT will forward information to his doctor and he is to follow-up with PCP to get an order. Pt reports then "CAP  program" will order and pay for walker.     Person(s) Educated  Patient    Methods  Explanation    Comprehension  Verbalized understanding;Need further instruction  PT Long Term Goals - 03/19/18 0700      PT LONG TERM GOAL #1   Title  Patient will be independent with HEP addressing bil LE weakness, bil LE range of motion (to prevent limitations caused by spasticity), and posture (due to chronic pain). (Target by 3rd visit or MCD assigned end date-which is TBD)    Baseline  No HEP    Time  3    Period  Weeks    Status  New      PT LONG TERM GOAL #2   Title  Patient will ambulate modified independent with assistive device (most appropriate device TBD) over level indoor and outdoor surfaces x 200 ft.    Baseline  Using rollator with broken/unusable brake on the right and too short for him resulting in poor posture and gait dynamics with increased risk of falling.     Time  3    Period  Weeks    Status  New             Plan - 03/19/18 2919    Clinical Impression Statement  Patient referred to PT due to bil LE weakness with history of thoracic myelopathy. Patient with left weaker than right lower extremity. Spasticity significantly worse in left lower extremity. He walks with increased reliance on upper extremity support via his rollator, with resulting flexed posture with forward head and excessive suboccipital extension. His rollator is in disrepair with currently no braking mechanism on the right side ("it broke off" per patient), putting him at even further increased fall risk. He reports 3 falls in the past 6 months. Patient can benefit from PT to assess appropriate DME to improve his safety with walking and to create an exercise program to address bil LE weakness and maintaining ROM in light of significant LLE spasiticity. These deficits (and the deficits listed below) are to be addressed via the PT interventions listed below.     History and Personal Factors relevant to  plan of care:  PMH-thoracic myelopathy, spine surgery, HTN, MI, tobacco use, OSA, bil LE DVTs, chronic pain; Personal factors-mechanism of injury (myelopathy); expected progression of patient (due to chronicity)    Clinical Presentation  Stable    Clinical Presentation due to:  myelopathy and spinal surgery >5 yrs ago    Clinical Decision Making  Low    Rehab Potential  Good for goals    Clinical Impairments Affecting Rehab Potential  chronicity of deficits     PT Frequency  1x / week    PT Duration  3 weeks    PT Treatment/Interventions  ADLs/Self Care Home Management;Aquatic Therapy;DME Instruction;Gait training;Stair training;Functional mobility training;Therapeutic activities;Therapeutic exercise;Balance training;Neuromuscular re-education;Manual techniques;Orthotic Fit/Training;Patient/family education;Passive range of motion    PT Next Visit Plan  if patient got MD prescription for UPwalker, further educate in use of UPwalker; initiate HEP for: LE strengthening, maintaining ROM (esp Left ankle with PF/supination spasticity), and posture    Consulted and Agree with Plan of Care  Patient       Patient will benefit from skilled therapeutic intervention in order to improve the following deficits and impairments:  Abnormal gait, Decreased activity tolerance, Decreased balance, Decreased coordination, Decreased endurance, Decreased knowledge of use of DME, Decreased mobility, Decreased strength, Impaired sensation, Impaired flexibility, Impaired tone, Improper body mechanics, Postural dysfunction, Obesity, Pain  Visit Diagnosis: Abnormal posture - Plan: PT plan of care cert/re-cert  Other abnormalities of gait and mobility - Plan: PT plan of care cert/re-cert  Other  symptoms and signs involving the nervous system - Plan: PT plan of care cert/re-cert  Unsteadiness on feet - Plan: PT plan of care cert/re-cert  Paraplegia, incomplete (HCC) - Plan: PT plan of care cert/re-cert     Problem  List Patient Active Problem List   Diagnosis Date Noted  . Deep vein thrombosis (DVT) of distal vein of both lower extremities (HCC) 01/23/2018  . Ready to quit smoking 10/14/2017  . 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 Gait abnormality 03/17/2017  . Occlusive coronary artery disease requiring drug therapy - occluded OM 3. Unable to revascularize 12/01/2016  . Chronic pain syndrome - related to previous back surgery and arthritis. He is on high-dose of narcotics at home. 12/01/2016  . 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 Tobacco use 11/30/2016  . Dyslipidemia, goal LDL below [address] [place], PT 03/19/2018, 7:18 AM  [facility] [address] [place], [place], [address] Phone: [phone]   Fax:  [phone]  Name: Henley Boettner MRN: [id number] Date of Birth: [birth year]

## 2018-03-22 ENCOUNTER — Other Ambulatory Visit: Payer: Self-pay | Admitting: Nurse Practitioner

## 2018-03-22 ENCOUNTER — Other Ambulatory Visit: Payer: Self-pay | Admitting: *Deleted

## 2018-03-22 MED ORDER — MISC. DEVICES MISC: 0 refills | Status: AC

## 2018-03-22 NOTE — Telephone Encounter (Signed)
Patient verified DOB Patient was seen by PT and was advised to use an UP walker to assist with ambulating.

## 2018-03-22 NOTE — Telephone Encounter (Signed)
Thank you! I did print the order, Hopefully they accept it, because it does not look like a prescription printed that can be faxed.

## 2018-03-22 NOTE — Telephone Encounter (Signed)
Please go to medication list and print patient's prescription for UP walker.  I have placed it under miscellaneous. Thank you

## 2018-03-23 NOTE — Telephone Encounter (Signed)
MA spoke with patients PT office and was advised that the office does not provide equipment. Patient contacted the PT office back and stated the CAP Program would order and pay for the walker, they just need a prescription from the PCP. PCP ordered walker in the system and MA printed a copy.  MA contacted the patient to inform him of the prescription being ready for pick up and he can then give it to his case manager to proceed. Case managers name is Gus Rankin [phone]

## 2018-03-25 ENCOUNTER — Encounter (INDEPENDENT_AMBULATORY_CARE_PROVIDER_SITE_OTHER): Payer: Self-pay

## 2018-03-25 ENCOUNTER — Ambulatory Visit (INDEPENDENT_AMBULATORY_CARE_PROVIDER_SITE_OTHER): Payer: Self-pay | Admitting: Orthopaedic Surgery

## 2018-04-04 ENCOUNTER — Telehealth: Payer: Self-pay | Admitting: Licensed Clinical Social Worker

## 2018-04-04 ENCOUNTER — Other Ambulatory Visit: Payer: Self-pay | Admitting: Physician Assistant

## 2018-04-04 DIAGNOSIS — F17200 Nicotine dependence, unspecified, uncomplicated: Secondary | ICD-10-CM
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
## 2018-04-06 ENCOUNTER — Ambulatory Visit: Payer: Medicaid Other | Admitting: Physical Therapy

## 2018-04-07 NOTE — Telephone Encounter (Signed)
The patient disclosed that he is not interested in initiating psychotherapy. I plan on speaking with him at his upcoming appointment about additional coping skills and benefits to therapy.

## 2018-04-07 NOTE — Telephone Encounter (Signed)
Noted. Is he being followed by [facility] or another mental health facility

## 2018-04-11 ENCOUNTER — Ambulatory Visit: Payer: Medicaid Other | Attending: Nurse Practitioner | Admitting: Physical Therapy

## 2018-04-11 DIAGNOSIS — R293 Abnormal posture: Secondary | ICD-10-CM | POA: Insufficient documentation

## 2018-04-11 DIAGNOSIS — R2689 Other abnormalities of gait and mobility: Secondary | ICD-10-CM | POA: Insufficient documentation

## 2018-04-11 DIAGNOSIS — R2681 Unsteadiness on feet: Secondary | ICD-10-CM | POA: Insufficient documentation

## 2018-04-11 DIAGNOSIS — R29818 Other symptoms and signs involving the nervous system: Secondary | ICD-10-CM | POA: Insufficient documentation

## 2018-04-13 NOTE — Telephone Encounter (Signed)
Noted! Thank you

## 2018-04-19 ENCOUNTER — Ambulatory Visit: Payer: Medicaid Other | Admitting: Physical Therapy

## 2018-04-19 ENCOUNTER — Encounter: Payer: Self-pay | Admitting: Physical Therapy

## 2018-04-19 DIAGNOSIS — R293 Abnormal posture: Secondary | ICD-10-CM | POA: Diagnosis present

## 2018-04-19 DIAGNOSIS — R29818 Other symptoms and signs involving the nervous system: Secondary | ICD-10-CM | POA: Diagnosis present

## 2018-04-19 DIAGNOSIS — R2689 Other abnormalities of gait and mobility: Secondary | ICD-10-CM | POA: Diagnosis present

## 2018-04-19 DIAGNOSIS — R2681 Unsteadiness on feet: Secondary | ICD-10-CM | POA: Diagnosis present

## 2018-04-19 NOTE — Patient Instructions (Signed)
Access Code: [email or web address]  URL: [email or web address]  Date: 04/19/2018  Prepared by: Veda Canning   Exercises  Supine Lower Trunk Rotation - 3 reps - 30 seconds hold - 2x daily - 7x weekly  Butterfly Groin Stretch - 3 reps - 30 seconds hold - 2x daily - 7x weekly  Supine Bridge - 10 reps - 3 sets - 5 seconds hold - 2x daily - 7x weekly  Standing Hip Abduction with Counter Support - 10 reps - 1 sets - 3 seconds hold - 2x daily - 7x weekly  Sit to Stand without Arm Support - 10 reps - 2 sets - 2x daily - 7x weekly  Supine March - 10 reps - 3 sets - 2x daily - 7x weekly

## 2018-04-19 NOTE — Therapy (Signed)
[facility] [address] [place], [place], [address] Phone: [phone]   Fax:  [phone]  Physical Therapy Treatment  Patient Details  Name: Keith Salinas MRN: [id number] Date of Birth: June 28, 1961 Referring Provider: Bertram Denver NP   Encounter Date: 04/19/2018  PT End of Session - 04/19/18 1255    Visit Number  2    Number of Visits  4 eval plus 3 visits    Date for PT Re-Evaluation  -- pending MCD approval     Authorization Type  Medicaid [place] Access;     Authorization Time Period  3 visits 6/5-6/25    Authorization - Visit Number  1    Authorization - Number of Visits  3 have requested 3 visits    PT Start Time  1146    PT Stop Time  1228    PT Time Calculation (min)  42 min    Activity Tolerance  Patient tolerated treatment well    Behavior During Therapy  WFL for tasks assessed/performed       Past Medical History:  Diagnosis Date  . Acute ST elevation myocardial infarction (STEMI) of inferior wall (HCC) 11/30/2016  . Gait abnormality 03/17/2017  . GERD (gastroesophageal reflux disease)   . Hypercholesteremia   . OSA (obstructive sleep apnea)   . 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 Tobacco use 11/30/2016    Past Surgical History:  Procedure Laterality Date  . CARDIAC CATHETERIZATION N/A 11/30/2016   Procedure: Left Heart Cath and Coronary Angiography;  Surgeon: Lyn Records, MD;  Location: [facility] INVASIVE CV LAB;  Service: Cardiovascular;  Laterality: N/A;  . CARDIAC CATHETERIZATION N/A 11/30/2016   Procedure: Coronary Balloon Angioplasty;  Surgeon: Lyn Records, MD;  Location: [facility] INVASIVE CV LAB;  Service: Cardiovascular;  Laterality: N/A;  . SPINE SURGERY      There were no vitals filed for this visit.  Subjective Assessment - 04/19/18 1249    Subjective  Reports he missed 2 previous appointments because 1) he was not feeling well, 2) he forgot and did not get a reminder call. He wanted to know if  he can get an extension for the 2 appointments he missed (currently there are no openings for 6/24-25 next week). Reports he was told he cannot get a new walker yet as Medicaid recently paid for his rollator (he thought the hospital gave him the walker, he didn't realize Medicaid had been billed). His Social Worker is aware current walker has a broken brake and is assisting him in getting it fixed (per pt).     Pertinent History  thoracic myelopathy, spine surgery(?2013 per pt), HTN, MI, tobacco use, OSA, bil LE DVTs, chronic pain    Limitations  Standing;Walking;Lifting    Patient Stated Goals  get legs stronger, know how to help them relax when they tighten up, get an UPwalker    Currently in Pain?  No/denies        Treatment-  Educated in Plan of Care and fact he missed his first 2 of 3 approved visits. Today focused on establishing an HEP to address core weakness, LE weakness, and stretches for leg muscles that are most effected by his spasms/increased tone (hamstrings, hip adductors, lumbar). Patient performed one set of each exercise added to his HEP and he demonstrated hamstring stretches that he already uses (standing forward bend to touch his toes, seated with one leg up on couch and bending over extended leg). Patient receptive to exercises and provided a  paper copy as well as e-mailed a link to his exercise program to the e-mail he provided.   Access Code: [email or web address]  URL: [email or web address]  Date: 04/19/2018  Prepared by: Veda Canning   Exercises  Supine Lower Trunk Rotation - 3 reps - 30 seconds hold - 2x daily - 7x weekly  Butterfly Groin Stretch - 3 reps - 30 seconds hold - 2x daily - 7x weekly  Supine Bridge - 10 reps - 3 sets - 5 seconds hold - 2x daily - 7x weekly  Standing Hip Abduction with Counter Support - 10 reps - 1 sets - 3 seconds hold - 2x daily - 7x weekly  Sit to Stand without Arm Support - 10 reps - 2 sets - 2x daily - 7x weekly  Supine March - 10  reps - 3 sets - 2x daily - 7x weekly                          PT Education - 04/19/18 1254    Education Details  see HEP in pt instructions; possible extension but cannot say for sure    Person(s) Educated  Patient    Methods  Explanation;Demonstration;Verbal cues;Handout    Comprehension  Verbalized understanding;Returned demonstration;Verbal cues required;Need further instruction          PT Long Term Goals - 03/19/18 0700      PT LONG TERM GOAL #1   Title  Patient will be independent with HEP addressing bil LE weakness, bil LE range of motion (to prevent limitations caused by spasticity), and posture (due to chronic pain). (Target by 3rd visit or MCD assigned end date-which is TBD)    Baseline  No HEP    Time  3    Period  Weeks    Status  New      PT LONG TERM GOAL #2   Title  Patient will ambulate modified independent with assistive device (most appropriate device TBD) over level indoor and outdoor surfaces x 200 ft.    Baseline  Using rollator with broken/unusable brake on the right and too short for him resulting in poor posture and gait dynamics with increased risk of falling.     Time  3    Period  Weeks    Status  New            Plan - 04/19/18 1256    Clinical Impression Statement  Patient instructed in HEP for strengthening and for stretching his LEs to assist with managing incr tone/tightness he experiences. Patient somewhat manic behaving with pressured speech and required frequent redirection to stay on task. Patient's aid arrived 1/2 way through his appointment and observed instruction in his HEP. Emphasized his need to do his exercises if he wants to see benefits/improvements. Patient verbalized understanding. Will request extension of his treatment dates to schedule his 2 remaining approved visits.     Rehab Potential  Good for goals    Clinical Impairments Affecting Rehab Potential  chronicity of deficits     PT Frequency  1x / week     PT Duration  3 weeks    PT Treatment/Interventions  ADLs/Self Care Home Management;Aquatic Therapy;DME Instruction;Gait training;Stair training;Functional mobility training;Therapeutic activities;Therapeutic exercise;Balance training;Neuromuscular re-education;Manual techniques;Orthotic Fit/Training;Patient/family education;Passive range of motion    PT Next Visit Plan  review HEP given 6/18: gait training with rollator (yes, has a broken brake on rt); Add posture exercises/back strengthening to assist with managing back pain; maintaining  ROM (esp Left ankle with PF/supination spasticity)    Consulted and Agree with Plan of Care  Patient       Patient will benefit from skilled therapeutic intervention in order to improve the following deficits and impairments:  Abnormal gait, Decreased activity tolerance, Decreased balance, Decreased coordination, Decreased endurance, Decreased knowledge of use of DME, Decreased mobility, Decreased strength, Impaired sensation, Impaired flexibility, Impaired tone, Improper body mechanics, Postural dysfunction, Obesity, Pain  Visit Diagnosis: Other symptoms and signs involving the nervous system  Abnormal posture     Problem List Patient Active Problem List   Diagnosis Date Noted  . Deep vein thrombosis (DVT) of distal vein of both lower extremities (HCC) 01/23/2018  . Ready to quit smoking 10/14/2017  . 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 Gait abnormality 03/17/2017  . Occlusive coronary artery disease requiring drug therapy - occluded OM 3. Unable to revascularize 12/01/2016  . Chronic pain syndrome - related to previous back surgery and arthritis. He is on high-dose of narcotics at home. 12/01/2016  . 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 Tobacco use 11/30/2016  . Dyslipidemia, goal LDL below [address] [place], PT 04/19/2018, 1:03 PM  [facility] [address] [place], [place], [address] Phone: [phone]   Fax:  [phone]  Name: Samin Milke MRN: [id number] Date of Birth: [birth year]

## 2018-04-26 ENCOUNTER — Encounter

## 2018-04-27 ENCOUNTER — Encounter: Payer: Self-pay | Admitting: Rehabilitative and Restorative Service Providers"

## 2018-04-27 ENCOUNTER — Ambulatory Visit: Payer: Medicaid Other | Admitting: Rehabilitative and Restorative Service Providers"

## 2018-04-27 DIAGNOSIS — R29818 Other symptoms and signs involving the nervous system: Secondary | ICD-10-CM

## 2018-04-27 DIAGNOSIS — R293 Abnormal posture: Secondary | ICD-10-CM

## 2018-04-27 DIAGNOSIS — R2689 Other abnormalities of gait and mobility: Secondary | ICD-10-CM

## 2018-04-27 DIAGNOSIS — R2681 Unsteadiness on feet: Secondary | ICD-10-CM
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
## 2018-04-29 ENCOUNTER — Ambulatory Visit: Payer: Medicaid Other | Admitting: Rehabilitative and Restorative Service Providers"

## 2018-05-02 ENCOUNTER — Ambulatory Visit: Payer: Medicaid Other | Admitting: Rehabilitative and Restorative Service Providers"

## 2018-05-03 ENCOUNTER — Ambulatory Visit: Payer: Medicaid Other | Admitting: Nurse Practitioner

## 2018-05-19 ENCOUNTER — Other Ambulatory Visit: Payer: Self-pay | Admitting: Physician Assistant

## 2018-05-19 ENCOUNTER — Telehealth: Payer: Self-pay | Admitting: Licensed Clinical Social Worker

## 2018-05-19 DIAGNOSIS — F17200 Nicotine dependence, unspecified, uncomplicated: Secondary | ICD-10-CM

## 2018-05-19 NOTE — Telephone Encounter (Signed)
Incoming call received from Ms. Corlis Leak, CAPS Supervisor [phone] to provide follow up information for PCP.   Ms. Adrian Blackwater reported that she has been working closely with Mr. Bouch regarding placement of in-home aids. He was recently discharged from an agency on Monday, July 15, 19. To date, this has been approximately the sixth agency that he has been discharged from.  Due to pt's ongoing disruptive behaviors and mental health concerns (irritability, paranoid thought patterns, impulsivity), he is in the process of being dis-enrolled from CAP services; however, he does have the right to appeal the decision.   Ms. Adrian Blackwater shared that pt has applied to hire his own aid; however, it has been denied due to pt's inability to self-direct his own care. Mr. Stokes disclosed to her that he has been participating in behavioral health services; although, he has not agreed to providing the name of the agency.   LCSWA informed Ms. Adrian Blackwater that she will follow up with patient at his next scheduled appointment with PCP.

## 2018-05-22 NOTE — Telephone Encounter (Signed)
Noted! Thank you

## 2018-06-02 ENCOUNTER — Encounter: Payer: Self-pay | Admitting: Rehabilitative and Restorative Service Providers"

## 2018-06-02 NOTE — Therapy (Signed)
[facility] [address] [place], [place], [address] Phone: [phone]   Fax:  [phone]  Patient Details  Name: Keith Salinas MRN: [id number] Date of Birth: [birth year] Referring Provider:  No ref. provider found  Encounter Date: last session 04/27/2018  PHYSICAL THERAPY DISCHARGE SUMMARY  Visits from Start of Care: 3  Current functional level related to goals / functional outcomes:  goals not reassessed due to patient not returning for last session. PT Long Term Goals - 03/19/18 0700      PT LONG TERM GOAL #1   Title  Patient will be independent with HEP addressing bil LE weakness, bil LE range of motion (to prevent limitations caused by spasticity), and posture (due to chronic pain). (Target by 3rd visit or MCD assigned end date-which is TBD)    Baseline  No HEP    Time  3    Period  Weeks    Status  New      PT LONG TERM GOAL #2   Title  Patient will ambulate modified independent with assistive device (most appropriate device TBD) over level indoor and outdoor surfaces x 200 ft.    Baseline  Using rollator with broken/unusable brake on the right and too short for him resulting in poor posture and gait dynamics with increased risk of falling.     Time  3    Period  Weeks    Status  New         Remaining deficits: See eval for patient status.   Education / Equipment: Home program, safety with equipment.  Plan: Patient agrees to discharge.  Patient goals were not met. Patient is being discharged due to not returning since the last visit.  ?????      Thank you for the referral of this patient. Rudell Cobb, MPT  Keith Salinas 06/02/2018, 12:48 PM  [facility] [address] [place], [place], [address] Phone: [phone]   Fax:  [phone]

## 2018-07-13 ENCOUNTER — Telehealth: Payer: Self-pay | Admitting: Licensed Clinical Social Worker
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
## 2018-07-14 ENCOUNTER — Ambulatory Visit: Payer: Medicaid Other

## 2018-09-27 ENCOUNTER — Encounter (HOSPITAL_COMMUNITY): Payer: Self-pay | Admitting: Emergency Medicine

## 2018-09-27 ENCOUNTER — Emergency Department (HOSPITAL_COMMUNITY): Payer: Medicaid Other

## 2018-09-27 ENCOUNTER — Other Ambulatory Visit: Payer: Self-pay

## 2018-09-27 ENCOUNTER — Emergency Department (HOSPITAL_COMMUNITY)
Admission: EM | Admit: 2018-09-27 | Discharge: 2018-09-27 | Discharge status: Against Medical Advice | Payer: Medicaid Other | Attending: Emergency Medicine | Admitting: Emergency Medicine

## 2018-09-27 DIAGNOSIS — Z7982 Long term (current) use of aspirin: Secondary | ICD-10-CM | POA: Diagnosis not present

## 2018-09-27 DIAGNOSIS — F17210 Nicotine dependence, cigarettes, uncomplicated: Secondary | ICD-10-CM | POA: Insufficient documentation

## 2018-09-27 DIAGNOSIS — Z79899 Other long term (current) drug therapy: Secondary | ICD-10-CM | POA: Diagnosis not present

## 2018-09-27 DIAGNOSIS — R109 Unspecified abdominal pain: Secondary | ICD-10-CM | POA: Insufficient documentation

## 2018-09-27 DIAGNOSIS — R531 Weakness: Secondary | ICD-10-CM | POA: Insufficient documentation

## 2018-09-27 LAB — I-STAT CHEM 8, ED
BUN: 9 mg/dL (ref 6–20)
CHLORIDE: 102 mmol/L (ref 98–111)
Calcium, Ion: 1.07 mmol/L — ABNORMAL LOW (ref 1.15–1.40)
Creatinine, Ser: 1.3 mg/dL — ABNORMAL HIGH (ref 0.61–1.24)
GLUCOSE: 109 mg/dL — AB (ref 70–99)
HCT: 48 % (ref 39.0–52.0)
Hemoglobin: 16.3 g/dL (ref 13.0–17.0)
POTASSIUM: 3.8 mmol/L (ref 3.5–5.1)
SODIUM: 138 mmol/L (ref 135–145)
TCO2: 24 mmol/L (ref 22–32)

## 2018-09-27 LAB — CBC
HCT: 46.7 % (ref 39.0–52.0)
Hemoglobin: 15.4 g/dL (ref 13.0–17.0)
MCH: 34.1 pg — AB (ref 26.0–34.0)
MCHC: 33 g/dL (ref 30.0–36.0)
MCV: 103.5 fL — AB (ref 80.0–100.0)
Platelets: 284 10*3/uL (ref 150–400)
RBC: 4.51 MIL/uL (ref 4.22–5.81)
RDW: 12.8 % (ref 11.5–15.5)
WBC: 9.7 10*3/uL (ref 4.0–10.5)
nRBC: 0 % (ref 0.0–0.2)

## 2018-09-27 LAB — PROTIME-INR
INR: 1.03
Prothrombin Time: 13.4 seconds (ref 11.4–15.2)

## 2018-09-27 LAB — COMPREHENSIVE METABOLIC PANEL
ALBUMIN: 4 g/dL (ref 3.5–5.0)
ALT: 23 U/L (ref 0–44)
AST: 25 U/L (ref 15–41)
Alkaline Phosphatase: 35 U/L — ABNORMAL LOW (ref 38–126)
Anion gap: 13 (ref 5–15)
BILIRUBIN TOTAL: 0.4 mg/dL (ref 0.3–1.2)
BUN: 8 mg/dL (ref 6–20)
CALCIUM: 9.2 mg/dL (ref 8.9–10.3)
CO2: 21 mmol/L — AB (ref 22–32)
CREATININE: 1.15 mg/dL (ref 0.61–1.24)
Chloride: 103 mmol/L (ref 98–111)
GFR calc Af Amer: >60 mL/min (ref >60)
GFR calc non Af Amer: >60 mL/min (ref >60)
GLUCOSE: 107 mg/dL — AB (ref 70–99)
Potassium: 3.7 mmol/L (ref 3.5–5.1)
SODIUM: 137 mmol/L (ref 135–145)
Total Protein: 8.3 g/dL — ABNORMAL HIGH (ref 6.5–8.1)

## 2018-09-27 LAB — SAMPLE TO BLOOD BANK

## 2018-09-27 LAB — ETHANOL: Alcohol, Ethyl (B): 57 mg/dL — ABNORMAL HIGH (ref <10)

## 2018-09-27 LAB — CDS SEROLOGY

## 2018-09-27 MED ORDER — MELOXICAM 7.5 MG PO TABS: 15.0000 mg | ORAL_TABLET | Freq: Every day | ORAL | Status: DC

## 2018-09-27 MED ORDER — FENTANYL CITRATE (PF) 100 MCG/2ML IJ SOLN
100.0000 ug | Freq: Once | INTRAMUSCULAR | Status: AC
  Administered 2018-09-27: 100 ug via INTRAVENOUS
  Filled 2018-09-27: qty 2

## 2018-09-27 MED ORDER — OXYCODONE HCL 5 MG PO TABA: 10.0000 mg | ORAL_TABLET | Freq: Every day | ORAL | Status: DC

## 2018-09-27 MED ORDER — FENTANYL CITRATE (PF) 100 MCG/2ML IJ SOLN
100.0000 ug | INTRAMUSCULAR | Status: DC | PRN
  Administered 2018-09-27: 100 ug via INTRAVENOUS
  Filled 2018-09-27: qty 2

## 2018-09-27 MED ORDER — PREGABALIN 100 MG PO CAPS: 300.0000 mg | ORAL_CAPSULE | Freq: Three times a day (TID) | ORAL | Status: DC

## 2018-09-27 MED ORDER — BUPROPION HCL ER (SR) 150 MG PO TB12: 150.0000 mg | ORAL_TABLET | Freq: Two times a day (BID) | ORAL | Status: DC

## 2018-09-27 MED ORDER — BACLOFEN 20 MG PO TABS: 20.0000 mg | ORAL_TABLET | Freq: Two times a day (BID) | ORAL | Status: DC

## 2018-09-27 MED ORDER — TIZANIDINE HCL 4 MG PO TABS: 2.0000 mg | ORAL_TABLET | Freq: Two times a day (BID) | ORAL | Status: DC

## 2018-09-27 MED ORDER — IOHEXOL 300 MG/ML  SOLN
100.0000 mL | Freq: Once | INTRAMUSCULAR | Status: AC | PRN
  Administered 2018-09-27: 100 mL via INTRAVENOUS
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
## 2018-09-27 NOTE — ED Notes (Signed)
Pt speaking with Pfeiffer, MD while being assessed and having MRI results discussed, this RN approached pt and MD who becomes agitated stating, "I have been wanting someone forever and now the doctor is here you are here." pt reassured that his providers and this RN was following up on his concerns, pt continues to state, "I am walking out of here."

## 2018-09-27 NOTE — ED Notes (Addendum)
Pt calling out in hallway for RN. This RN approached pt and he stated "you werent the one I was calling". I informed pt that I would like to help him. Pt states "then you can help me pull my pants up and leave." Pt encouraged to stay and wait for follow up. Pt refused. IV was removed. Pt continued to be loud and verbally abusive. Pt was escorted to the lobby with security present.Dr. Donnald Garre aware
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
## 2018-09-27 NOTE — ED Notes (Signed)
Pt agitated re: care and being in a progressive bed, this Rn spoke with pt and reassured his care is being delivered and concerns addressed, will have patient experience talk with pt when available in the meanwhile Dr. Bebe Shaggy is at the bedside addressing the pts concerns and plan of care, pt remains agitated

## 2018-09-27 NOTE — ED Notes (Signed)
Paged level 2- Per Dr. Bebe Shaggy
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
## 2018-09-27 NOTE — ED Notes (Signed)
Pt speaking with pt experience regarding his concerns

## 2018-09-27 NOTE — ED Notes (Signed)
Pt noted in hall attempting to leave, Keith Salinas, [place] removed IV and pt declining to  Be assisted to lobby, pt leaving AMA, pt remains agitated and verbally abusive to staff, pt left his progressive bed and went to lobby

## 2019-11-21 DIAGNOSIS — G4733 Obstructive sleep apnea (adult) (pediatric): Secondary | ICD-10-CM | POA: Insufficient documentation
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
## 2023-01-20 DIAGNOSIS — Z8249 Family history of ischemic heart disease and other diseases of the circulatory system: Secondary | ICD-10-CM | POA: Insufficient documentation

## 2023-01-20 DIAGNOSIS — F1010 Alcohol abuse, uncomplicated: Secondary | ICD-10-CM | POA: Insufficient documentation

## 2023-01-20 DIAGNOSIS — G8194 Hemiplegia, unspecified affecting left nondominant side: Secondary | ICD-10-CM | POA: Insufficient documentation

## 2023-01-20 DIAGNOSIS — I252 Old myocardial infarction: Secondary | ICD-10-CM | POA: Insufficient documentation

## 2023-01-20 DIAGNOSIS — Z823 Family history of stroke: Secondary | ICD-10-CM | POA: Insufficient documentation

## 2023-01-20 DIAGNOSIS — Z8673 Personal history of transient ischemic attack (TIA), and cerebral infarction without residual deficits: Secondary | ICD-10-CM | POA: Insufficient documentation

## 2023-01-20 DIAGNOSIS — F1210 Cannabis abuse, uncomplicated: Secondary | ICD-10-CM | POA: Insufficient documentation

## 2023-04-06 ENCOUNTER — Ambulatory Visit (INDEPENDENT_AMBULATORY_CARE_PROVIDER_SITE_OTHER): Payer: Medicaid Other | Admitting: Family Medicine

## 2023-04-06 ENCOUNTER — Encounter: Payer: Self-pay | Admitting: Family Medicine

## 2023-04-06 VITALS — BP 136/79 | HR 97 | Ht 71.0 in | Wt 222.0 lb

## 2023-04-06 DIAGNOSIS — Z1159 Encounter for screening for other viral diseases: Secondary | ICD-10-CM

## 2023-04-06 DIAGNOSIS — Z114 Encounter for screening for human immunodeficiency virus [HIV]: Secondary | ICD-10-CM

## 2023-04-06 DIAGNOSIS — E785 Hyperlipidemia, unspecified: Secondary | ICD-10-CM | POA: Diagnosis not present

## 2023-04-06 DIAGNOSIS — K219 Gastro-esophageal reflux disease without esophagitis: Secondary | ICD-10-CM

## 2023-04-06 DIAGNOSIS — M25561 Pain in right knee: Secondary | ICD-10-CM | POA: Insufficient documentation

## 2023-04-06 DIAGNOSIS — G894 Chronic pain syndrome: Secondary | ICD-10-CM | POA: Diagnosis not present

## 2023-04-06 DIAGNOSIS — Z1329 Encounter for screening for other suspected endocrine disorder: Secondary | ICD-10-CM

## 2023-04-06 DIAGNOSIS — R12 Heartburn: Secondary | ICD-10-CM | POA: Diagnosis not present

## 2023-04-06 DIAGNOSIS — E559 Vitamin D deficiency, unspecified: Secondary | ICD-10-CM

## 2023-04-06 DIAGNOSIS — G8929 Other chronic pain: Secondary | ICD-10-CM

## 2023-04-06 DIAGNOSIS — I2510 Atherosclerotic heart disease of native coronary artery without angina pectoris: Secondary | ICD-10-CM

## 2023-04-06 MED ORDER — WALKER MISC: 0 refills | Status: AC

## 2023-04-06 MED ORDER — OXYCODONE-ACETAMINOPHEN 10-325 MG PO TABS: 1.0000 | ORAL_TABLET | ORAL | 0 refills | Status: AC | PRN

## 2023-04-06 MED ORDER — ROSUVASTATIN CALCIUM 20 MG PO TABS: 20.0000 mg | ORAL_TABLET | Freq: Every day | ORAL | 1 refills | Status: AC

## 2023-04-06 MED ORDER — PREGABALIN 200 MG PO CAPS: 200.0000 mg | ORAL_CAPSULE | Freq: Every day | ORAL | 0 refills | Status: AC

## 2023-04-06 MED ORDER — ALBUTEROL SULFATE HFA 108 (90 BASE) MCG/ACT IN AERS: 2.0000 | INHALATION_SPRAY | Freq: Four times a day (QID) | RESPIRATORY_TRACT | 2 refills | Status: AC | PRN

## 2023-04-06 MED ORDER — PANTOPRAZOLE SODIUM 40 MG PO TBEC: 40.0000 mg | DELAYED_RELEASE_TABLET | Freq: Every day | ORAL | 11 refills | Status: AC

## 2023-04-06 NOTE — Assessment & Plan Note (Addendum)
Percocet 10-325 mg x 5 days Xray ordered awaiting results will follow up Explained to patient Non pharmacological interventions include the use of ice or heat, rest, recommend range of motion exercises, gentle stretching. for pain management.  Follow up for worsening or persistent symptoms. Patient verbalizes understanding regarding plan of care and all questions answered.

## 2023-04-06 NOTE — Progress Notes (Signed)
New Patient Office Visit   Subjective   Patient ID: Keith Salinas, male    DOB: [birth year]  Age: 62 y.o. MRN: [id number]  CC:  Chief Complaint  Patient presents with   Establish Care    Patient is here to establish care. Complains of acid reflux when eating spicy food that someone cooks for him. Complains of back pain when walking.     HPI Keith Salinas presents to establish care. He  has a past medical history of Acute ST elevation myocardial infarction (STEMI) of inferior wall (HCC) (11/30/2016), Gait abnormality (03/17/2017), GERD (gastroesophageal reflux disease), Hypercholesteremia, OSA (obstructive sleep apnea), Thoracic myelopathy (03/17/2017), and Tobacco use (11/30/2016).  Knee Pain  The incident occurred more than 1 week ago. The injury mechanism was a direct blow and a fall. The pain is present in the right knee. The quality of the pain is described as aching and burning. The pain is at a severity of 8/10. The pain has been Constant since onset. Associated symptoms include an inability to bear weight and muscle weakness. Pertinent negatives include no loss of motion or loss of sensation. He reports no foreign bodies present. The symptoms are aggravated by movement. He has tried NSAIDs for the symptoms. The treatment provided no relief.  Gastroesophageal Reflux He complains of abdominal pain, belching and heartburn. He reports no chest pain or no dysphagia. This is a chronic problem. The problem occurs constantly. The problem has been gradually worsening. The symptoms are aggravated by certain foods and ETOH. Associated symptoms include fatigue. Risk factors include caffeine use, lack of exercise, NSAIDs and smoking/tobacco exposure. He has tried an antacid and a diet change for the symptoms. The treatment provided no relief.      Outpatient Encounter Medications as of 04/06/2023  Medication Sig   albuterol (VENTOLIN HFA) 108 (90 Base) MCG/ACT inhaler Inhale 2 puffs into the lungs  every 6 (six) hours as needed for wheezing or shortness of breath.   Misc. Devices (WALKER) MISC DAILY   oxyCODONE-acetaminophen (PERCOCET) 10-325 MG tablet Take 1 tablet by mouth every 4 (four) hours as needed for up to 5 days for pain.   ticagrelor (BRILINTA) 90 MG TABS tablet Take by mouth.   [DISCONTINUED] rosuvastatin (CRESTOR) 20 MG tablet Take by mouth.   aspirin 81 MG chewable tablet Chew 1 tablet (81 mg total) by mouth daily. (Patient not taking: Reported on 04/06/2023)   baclofen (LIORESAL) 10 MG tablet Take 20 mg by mouth 2 (two) times daily. (Patient not taking: Reported on 04/06/2023)   buPROPion (WELLBUTRIN SR) 150 MG 12 hr tablet Take 1 tablet (150 mg total) by mouth 2 (two) times daily. (Patient not taking: Reported on 04/06/2023)   carvedilol (COREG) 3.125 MG tablet Take 1 tablet (3.125 mg total) by mouth 2 (two) times daily with a meal. (Patient not taking: Reported on 04/06/2023)   clopidogrel (PLAVIX) 75 MG tablet Take by mouth. (Patient not taking: Reported on 04/06/2023)   furosemide (LASIX) 20 MG tablet Take 1 tablet (20 mg total) by mouth daily. (Patient not taking: Reported on 04/06/2023)   meloxicam (MOBIC) 15 MG tablet Take 15 mg by mouth daily. (Patient not taking: Reported on 04/06/2023)   Misc. Devices MISC Please provide patient with an UP walker for assistance with mobility (Patient not taking: Reported on 04/06/2023)   nicotine (NICODERM CQ - DOSED IN MG/24 HOURS) 14 mg/24hr patch PLACE 1 PATCH ONTO THE SKIN DAILY. (Patient not taking: Reported on 04/06/2023)   NICOTINE STEP  3 7 MG/24HR patch PLACE 1 PATCH ONTO THE SKIN DAILY. (Patient not taking: Reported on 04/06/2023)   nitroGLYCERIN (NITROSTAT) 0.4 MG SL tablet Place 1 tablet (0.4 mg total) under the tongue every 5 (five) minutes as needed for chest pain. (Patient not taking: Reported on 04/06/2023)   OxyCODONE HCl, Abuse Deter, (OXAYDO) 5 MG TABA Take 10 mg by mouth daily.  (Patient not taking: Reported on 04/06/2023)   pantoprazole  (PROTONIX) 40 MG tablet Take 1 tablet (40 mg total) by mouth daily.   potassium chloride (K-DUR) 10 MEQ tablet Take 1 tablet (10 mEq total) by mouth daily.   pregabalin (LYRICA) 200 MG capsule Take 1 capsule (200 mg total) by mouth daily.   QUEtiapine (SEROQUEL) 100 MG tablet Take 100 mg by mouth at bedtime as needed.  (Patient not taking: Reported on 04/06/2023)   rosuvastatin (CRESTOR) 20 MG tablet Take 1 tablet (20 mg total) by mouth daily.   tiZANidine (ZANAFLEX) 2 MG tablet Take 2 mg by mouth 2 (two) times daily as needed for muscle spasms.  (Patient not taking: Reported on 04/06/2023)   [DISCONTINUED] atorvastatin (LIPITOR) 80 MG tablet Take 1 tablet (80 mg total) by mouth daily at 6 PM. (Patient not taking: Reported on 04/06/2023)   [DISCONTINUED] pantoprazole (PROTONIX) 40 MG tablet Take 1 tablet (40 mg total) by mouth daily. (Patient not taking: Reported on 04/06/2023)   [DISCONTINUED] pregabalin (LYRICA) 200 MG capsule Take 200 mg by mouth 3 (three) times daily.  (Patient not taking: Reported on 04/06/2023)   No facility-administered encounter medications on file as of 04/06/2023.    Past Surgical History:  Procedure Laterality Date   CARDIAC CATHETERIZATION N/A 11/30/2016   Procedure: Left Heart Cath and Coronary Angiography;  Surgeon: Lyn Records, MD;  Location: [facility] INVASIVE CV LAB;  Service: Cardiovascular;  Laterality: N/A;   CARDIAC CATHETERIZATION N/A 11/30/2016   Procedure: Coronary Balloon Angioplasty;  Surgeon: Lyn Records, MD;  Location: [facility] INVASIVE CV LAB;  Service: Cardiovascular;  Laterality: N/A;   SPINE SURGERY      Review of Systems  Constitutional:  Positive for fatigue. Negative for chills and fever.  Eyes:  Negative for blurred vision.  Respiratory:  Negative for shortness of breath.   Cardiovascular:  Negative for chest pain.  Gastrointestinal:  Positive for abdominal pain and heartburn. Negative for dysphagia.  Genitourinary:  Negative for dysuria.  Musculoskeletal:   Positive for falls, joint pain and myalgias.  Neurological:  Negative for dizziness and headaches.      Objective    BP 136/79   Pulse 97   Ht 5\' 11"  (1.803 m)   Wt 222 lb (100.7 kg)   SpO2 95%   BMI 30.96 kg/m   Physical Exam Vitals reviewed.  Constitutional:      General: He is not in acute distress.    Appearance: Normal appearance. He is not ill-appearing, toxic-appearing or diaphoretic.  HENT:     Head: Normocephalic.  Eyes:     General:        Right eye: No discharge.        Left eye: No discharge.     Conjunctiva/sclera: Conjunctivae normal.  Cardiovascular:     Rate and Rhythm: Normal rate.     Pulses: Normal pulses.     Heart sounds: Normal heart sounds.  Pulmonary:     Effort: Pulmonary effort is normal. No respiratory distress.     Breath sounds: Normal breath sounds.  Abdominal:  General: Bowel sounds are normal.     Palpations: Abdomen is soft.     Tenderness: There is no abdominal tenderness. There is no guarding.  Musculoskeletal:        General: Normal range of motion.     Cervical back: Normal range of motion.  Skin:    General: Skin is warm and dry.     Capillary Refill: Capillary refill takes less than 2 seconds.  Neurological:     General: No focal deficit present.     Mental Status: He is alert and oriented to person, place, and time.     Motor: Weakness present.     Coordination: Coordination abnormal.     Gait: Gait abnormal.  Psychiatric:        Mood and Affect: Mood normal.        Behavior: Behavior normal.        Thought Content: Thought content normal.       Assessment & Plan:  Heartburn  Screening for HIV (human immunodeficiency virus) -     HIV Antibody (routine testing w rflx)  Need for hepatitis C screening test -     Hepatitis C antibody  Vitamin D deficiency -     VITAMIN D 25 Hydroxy (Vit-D Deficiency, Fractures)  Screening for thyroid disorder -     TSH + free T4  Chronic pain syndrome -     Ambulatory  referral to Pain Clinic -     Ambulatory referral to Physical Therapy  Dyslipidemia  Occlusive coronary artery disease requiring drug therapy - occluded OM 3. Unable to revascularize  Chronic pain of right knee Assessment & Plan: Percocet 10-325 mg x 5 days Xray ordered awaiting results will follow up Explained to patient Non pharmacological interventions include the use of ice or heat, rest, recommend range of motion exercises, gentle stretching. for pain management.  Follow up for worsening or persistent symptoms. Patient verbalizes understanding regarding plan of care and all questions answered.    Orders: -     DG Knee 1-2 Views Right; Future -     oxyCODONE-Acetaminophen; Take 1 tablet by mouth every 4 (four) hours as needed for up to 5 days for pain.  Dispense: 30 tablet; Refill: 0 -     Ambulatory referral to Physical Therapy  Mild acid reflux Assessment & Plan: Refilled Protonix 40 mg Explained to patient lifestyle modifications, weight loss, smoking cessation, loose clothing, and avoiding caffeine and trigger foods. Follow up in 4 weeks, if symptoms persist possible referral to EGD    Other orders -     Pantoprazole Sodium; Take 1 tablet (40 mg total) by mouth daily.  Dispense: 30 tablet; Refill: 11 -     Pregabalin; Take 1 capsule (200 mg total) by mouth daily.  Dispense: 30 capsule; Refill: 0 -     Rosuvastatin Calcium; Take 1 tablet (20 mg total) by mouth daily.  Dispense: 90 tablet; Refill: 1 -     Walker; DAILY  Dispense: 1 each; Refill: 0 -     Albuterol Sulfate HFA; Inhale 2 puffs into the lungs every 6 (six) hours as needed for wheezing or shortness of breath.  Dispense: 8 g; Refill: 2    Return in about 4 months (around 08/06/2023), or if symptoms worsen or fail to improve, for routine labs, chronic follow-up.   Cruzita Lederer Newman Nip, FNP

## 2023-04-06 NOTE — Assessment & Plan Note (Signed)
Refilled Protonix 40 mg Explained to patient lifestyle modifications, weight loss, smoking cessation, loose clothing, and avoiding caffeine and trigger foods. Follow up in 4 weeks, if symptoms persist possible referral to EGD

## 2023-04-06 NOTE — Patient Instructions (Signed)

## 2023-04-14 ENCOUNTER — Other Ambulatory Visit: Payer: Self-pay | Admitting: Family Medicine

## 2023-08-09 ENCOUNTER — Ambulatory Visit: Payer: Medicaid Other | Admitting: Family Medicine

## 2023-08-17 ENCOUNTER — Encounter: Payer: Self-pay | Admitting: Family Medicine
